# Patient Record
Sex: Male | Born: 1980 | Race: White | Hispanic: No | Marital: Married | Smoking: Never smoker
Health system: Southern US, Community
[De-identification: ages and names within clinical notes are randomized; demographics above are authoritative.]

## PROBLEM LIST (undated history)

## (undated) DIAGNOSIS — B009 Herpesviral infection, unspecified: Secondary | ICD-10-CM

## (undated) DIAGNOSIS — C6290 Malignant neoplasm of unspecified testis, unspecified whether descended or undescended: Secondary | ICD-10-CM

## (undated) HISTORY — DX: Malignant neoplasm of unspecified testis, unspecified whether descended or undescended: C62.90

## (undated) HISTORY — DX: Herpesviral infection, unspecified: B00.9

## (undated) HISTORY — PX: WISDOM TOOTH EXTRACTION: SHX21

## (undated) HISTORY — PX: ORCHIECTOMY: SHX2116

---

## 2002-05-18 ENCOUNTER — Ambulatory Visit (HOSPITAL_COMMUNITY): Admission: RE | Admit: 2002-05-18 | Discharge: 2002-05-18 | Payer: Self-pay | Admitting: Urology

## 2002-05-18 ENCOUNTER — Encounter (INDEPENDENT_AMBULATORY_CARE_PROVIDER_SITE_OTHER): Payer: Self-pay

## 2002-06-07 ENCOUNTER — Encounter: Admission: RE | Admit: 2002-06-07 | Discharge: 2002-06-07 | Payer: Self-pay | Admitting: Urology

## 2002-06-07 ENCOUNTER — Encounter: Payer: Self-pay | Admitting: Urology

## 2002-06-15 ENCOUNTER — Ambulatory Visit (HOSPITAL_COMMUNITY): Admission: RE | Admit: 2002-06-15 | Discharge: 2002-06-15 | Payer: Self-pay | Admitting: Oncology

## 2002-06-16 ENCOUNTER — Ambulatory Visit: Admission: RE | Admit: 2002-06-16 | Discharge: 2002-06-16 | Payer: Self-pay | Admitting: Oncology

## 2002-06-17 ENCOUNTER — Encounter: Admission: RE | Admit: 2002-06-17 | Discharge: 2002-06-17 | Payer: Self-pay | Admitting: Oncology

## 2002-06-17 ENCOUNTER — Encounter: Payer: Self-pay | Admitting: Oncology

## 2002-06-17 ENCOUNTER — Ambulatory Visit (HOSPITAL_COMMUNITY): Admission: RE | Admit: 2002-06-17 | Discharge: 2002-06-17 | Payer: Self-pay | Admitting: Oncology

## 2002-06-24 ENCOUNTER — Encounter: Payer: Self-pay | Admitting: Oncology

## 2002-06-24 ENCOUNTER — Ambulatory Visit (HOSPITAL_COMMUNITY): Admission: RE | Admit: 2002-06-24 | Discharge: 2002-06-24 | Payer: Self-pay | Admitting: Oncology

## 2002-07-18 ENCOUNTER — Emergency Department (HOSPITAL_COMMUNITY): Admission: EM | Admit: 2002-07-18 | Discharge: 2002-07-18 | Payer: Self-pay | Admitting: Emergency Medicine

## 2002-08-26 ENCOUNTER — Encounter: Payer: Self-pay | Admitting: Oncology

## 2002-08-26 ENCOUNTER — Encounter: Admission: RE | Admit: 2002-08-26 | Discharge: 2002-08-26 | Payer: Self-pay | Admitting: Oncology

## 2002-09-24 ENCOUNTER — Encounter: Payer: Self-pay | Admitting: Oncology

## 2002-09-24 ENCOUNTER — Ambulatory Visit (HOSPITAL_COMMUNITY): Admission: RE | Admit: 2002-09-24 | Discharge: 2002-09-24 | Payer: Self-pay | Admitting: Oncology

## 2002-10-05 ENCOUNTER — Encounter: Payer: Self-pay | Admitting: *Deleted

## 2002-10-05 ENCOUNTER — Encounter: Admission: RE | Admit: 2002-10-05 | Discharge: 2002-10-05 | Payer: Self-pay | Admitting: *Deleted

## 2002-12-08 ENCOUNTER — Encounter: Payer: Self-pay | Admitting: Oncology

## 2002-12-08 ENCOUNTER — Ambulatory Visit (HOSPITAL_COMMUNITY): Admission: RE | Admit: 2002-12-08 | Discharge: 2002-12-08 | Payer: Self-pay | Admitting: Oncology

## 2002-12-14 ENCOUNTER — Encounter: Payer: Self-pay | Admitting: *Deleted

## 2002-12-14 ENCOUNTER — Encounter: Admission: RE | Admit: 2002-12-14 | Discharge: 2002-12-14 | Payer: Self-pay | Admitting: *Deleted

## 2002-12-17 ENCOUNTER — Ambulatory Visit: Admission: RE | Admit: 2002-12-17 | Discharge: 2002-12-17 | Payer: Self-pay | Admitting: Urology

## 2002-12-31 ENCOUNTER — Encounter: Payer: Self-pay | Admitting: Oncology

## 2002-12-31 ENCOUNTER — Encounter: Admission: RE | Admit: 2002-12-31 | Discharge: 2002-12-31 | Payer: Self-pay | Admitting: Oncology

## 2003-02-12 ENCOUNTER — Encounter: Payer: Self-pay | Admitting: Oncology

## 2003-02-12 ENCOUNTER — Ambulatory Visit (HOSPITAL_COMMUNITY): Admission: RE | Admit: 2003-02-12 | Discharge: 2003-02-12 | Payer: Self-pay | Admitting: Oncology

## 2003-04-01 ENCOUNTER — Ambulatory Visit (HOSPITAL_COMMUNITY): Admission: RE | Admit: 2003-04-01 | Discharge: 2003-04-01 | Payer: Self-pay | Admitting: Oncology

## 2003-05-28 HISTORY — PX: OTHER SURGICAL HISTORY: SHX169

## 2003-07-29 ENCOUNTER — Ambulatory Visit (HOSPITAL_COMMUNITY): Admission: RE | Admit: 2003-07-29 | Discharge: 2003-07-29 | Payer: Self-pay | Admitting: Oncology

## 2003-08-26 ENCOUNTER — Inpatient Hospital Stay (HOSPITAL_COMMUNITY): Admission: AD | Admit: 2003-08-26 | Discharge: 2003-09-04 | Payer: Self-pay

## 2003-09-07 ENCOUNTER — Ambulatory Visit (HOSPITAL_COMMUNITY): Admission: RE | Admit: 2003-09-07 | Discharge: 2003-09-07 | Payer: Self-pay | Admitting: General Surgery

## 2003-09-30 ENCOUNTER — Ambulatory Visit (HOSPITAL_COMMUNITY): Admission: RE | Admit: 2003-09-30 | Discharge: 2003-09-30 | Payer: Self-pay | Admitting: Oncology

## 2003-12-02 ENCOUNTER — Ambulatory Visit (HOSPITAL_COMMUNITY): Admission: RE | Admit: 2003-12-02 | Discharge: 2003-12-02 | Payer: Self-pay | Admitting: Oncology

## 2004-02-15 ENCOUNTER — Ambulatory Visit (HOSPITAL_COMMUNITY): Admission: RE | Admit: 2004-02-15 | Discharge: 2004-02-15 | Payer: Self-pay | Admitting: Oncology

## 2004-04-01 ENCOUNTER — Ambulatory Visit: Payer: Self-pay | Admitting: Oncology

## 2004-05-22 ENCOUNTER — Encounter: Admission: RE | Admit: 2004-05-22 | Discharge: 2004-05-22 | Payer: Self-pay | Admitting: Oncology

## 2004-07-04 ENCOUNTER — Ambulatory Visit: Payer: Self-pay | Admitting: Oncology

## 2004-07-13 ENCOUNTER — Ambulatory Visit (HOSPITAL_COMMUNITY): Admission: RE | Admit: 2004-07-13 | Discharge: 2004-07-13 | Payer: Self-pay | Admitting: Oncology

## 2004-10-04 ENCOUNTER — Ambulatory Visit: Payer: Self-pay | Admitting: Oncology

## 2004-11-09 ENCOUNTER — Encounter: Admission: RE | Admit: 2004-11-09 | Discharge: 2004-11-09 | Payer: Self-pay | Admitting: Oncology

## 2004-11-19 ENCOUNTER — Ambulatory Visit: Payer: Self-pay | Admitting: Oncology

## 2005-02-28 IMAGING — CT CT CHEST W/ CM
2 of 5 series · 13 of 32 positions shown, 19 images · non-contrast
Comparison: none

CLINICAL DATA: Follow-up testicular carcinoma.

[Series 4: a/p 5.0 b30f · axial · 0.68mm/px · z∈[-497,-127]mm · 11 of 90 slices shown, 17 images]
[im 8/90  soft-tissue]
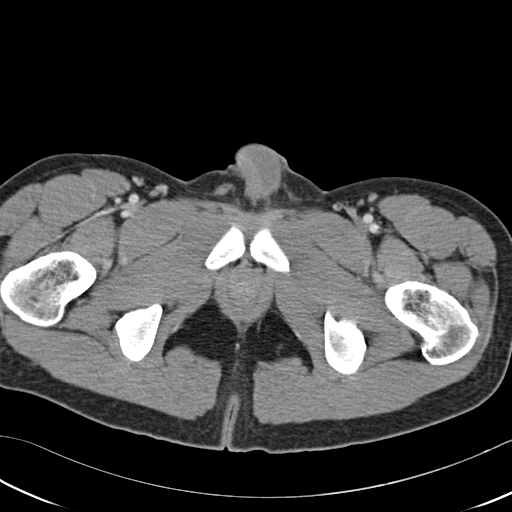
[im 8/90  bone]
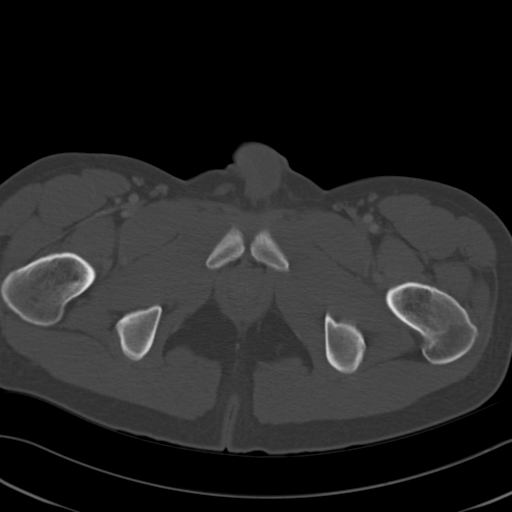
[im 15/90  soft-tissue]
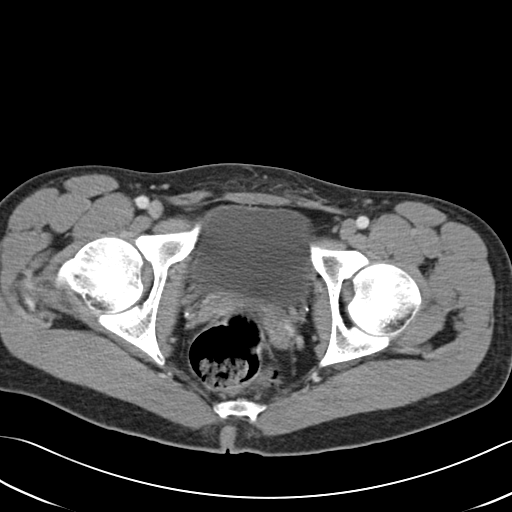
[im 23/90  soft-tissue]
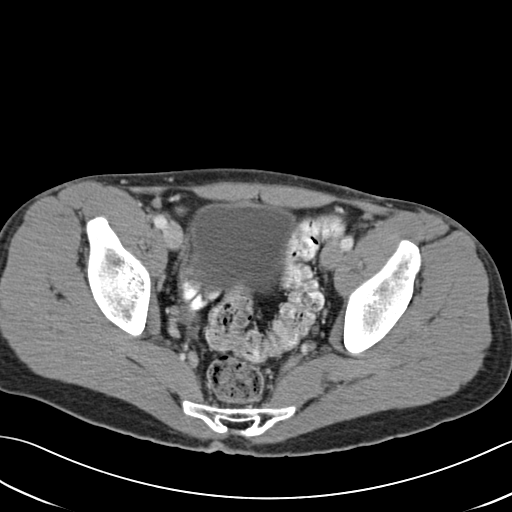
[im 30/90  soft-tissue]
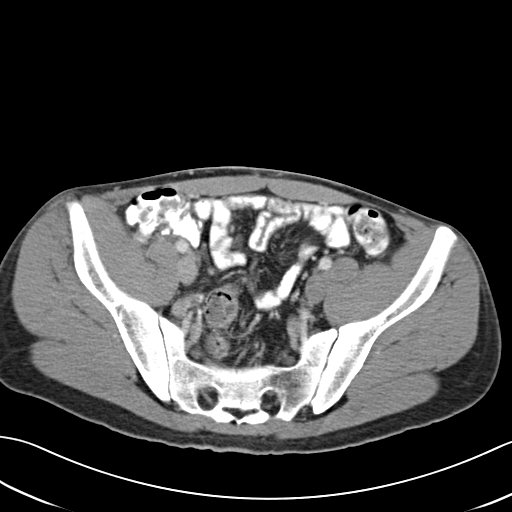
[im 38/90  soft-tissue]
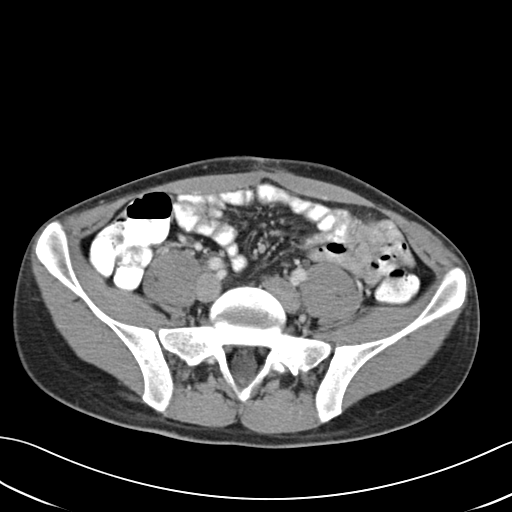
[im 45/90  soft-tissue]
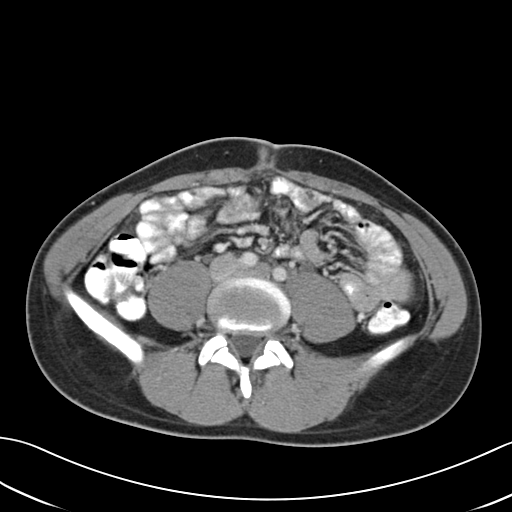
[im 52/90  soft-tissue]
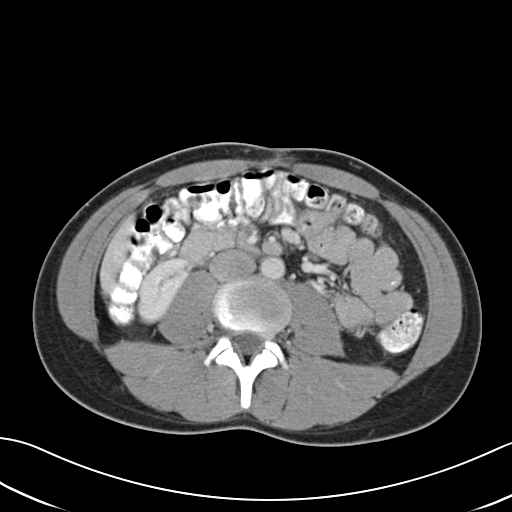
[im 60/90  soft-tissue]
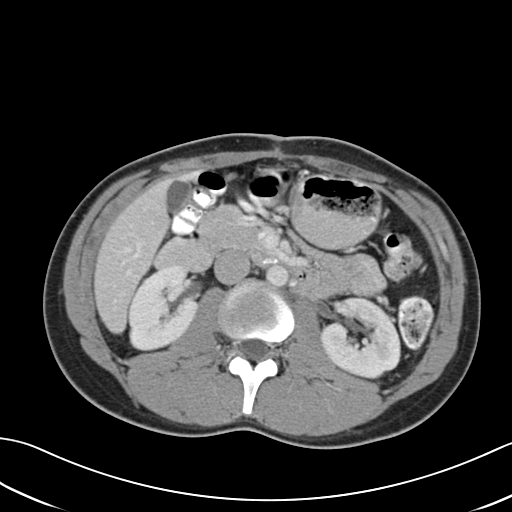
[im 60/90  lung]
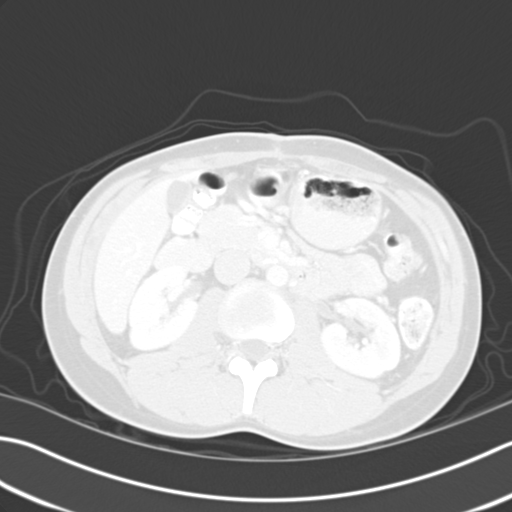
[im 67/90  soft-tissue]
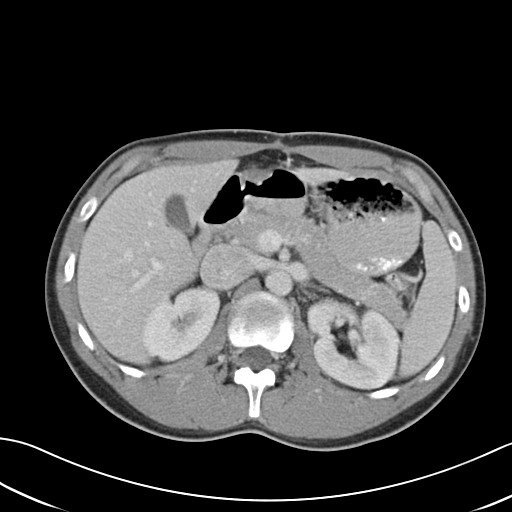
[im 67/90  lung]
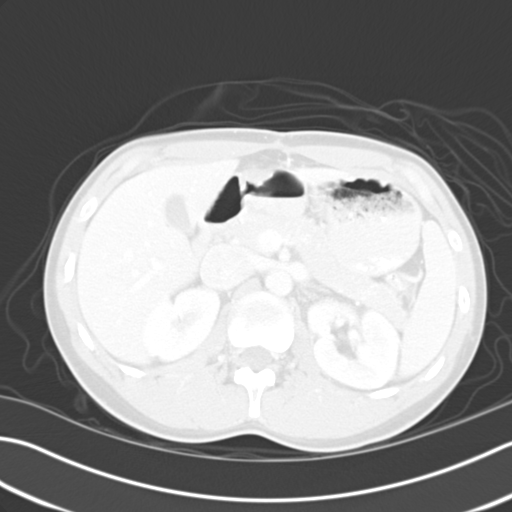
[im 67/90  bone]
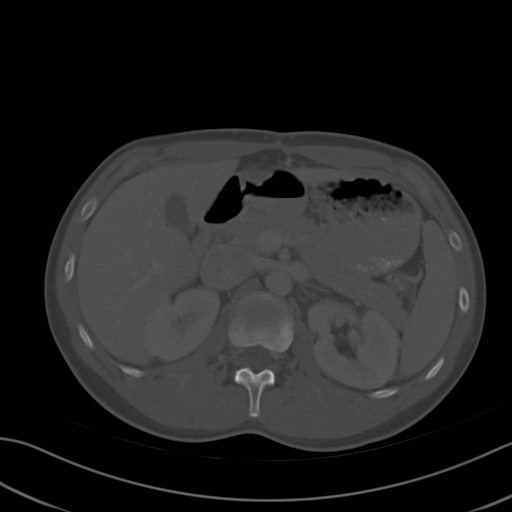
[im 75/90  soft-tissue]
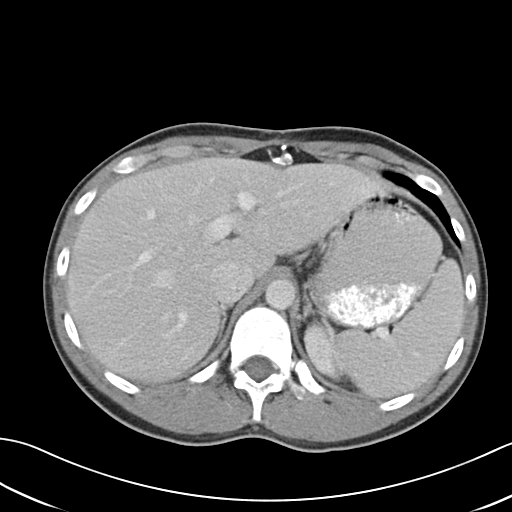
[im 75/90  lung]
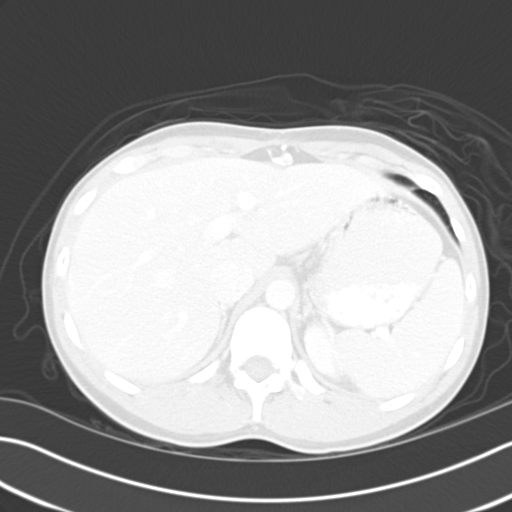
[im 82/90  soft-tissue]
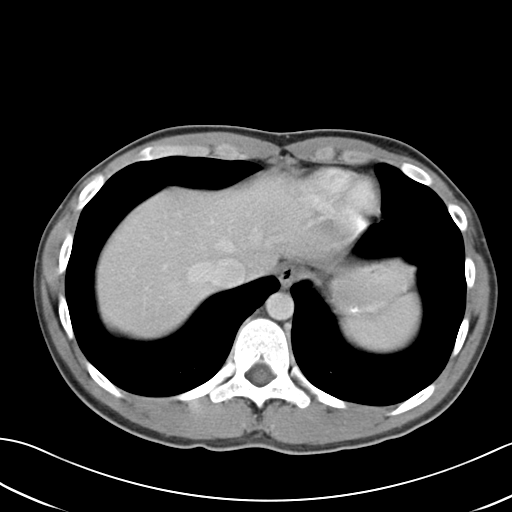
[im 82/90  lung]
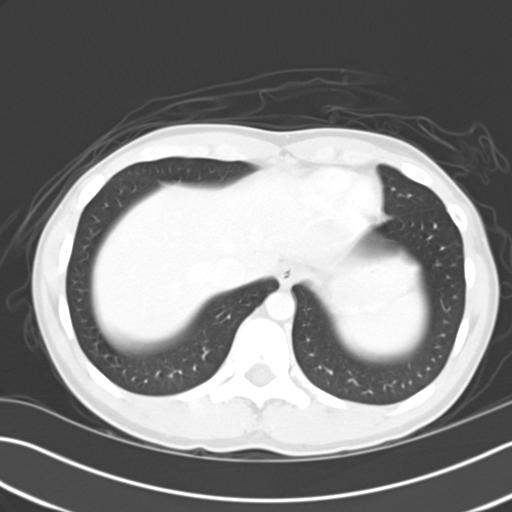

[Series 6: blad del 5.0 b30f · axial · 0.68mm/px · z∈[-492,-452]mm · 2 of 26 slices shown]
[im 9/26  soft-tissue]
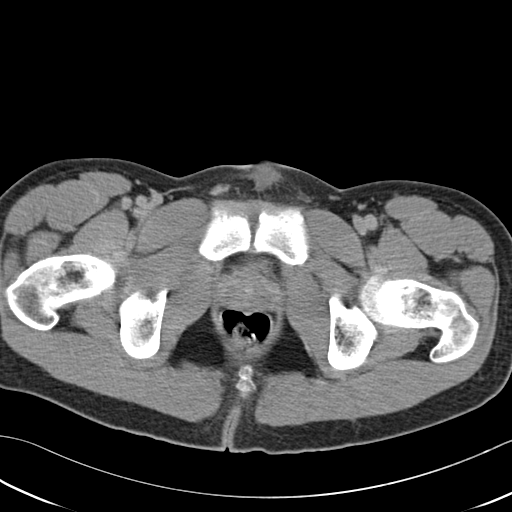
[im 17/26  soft-tissue]
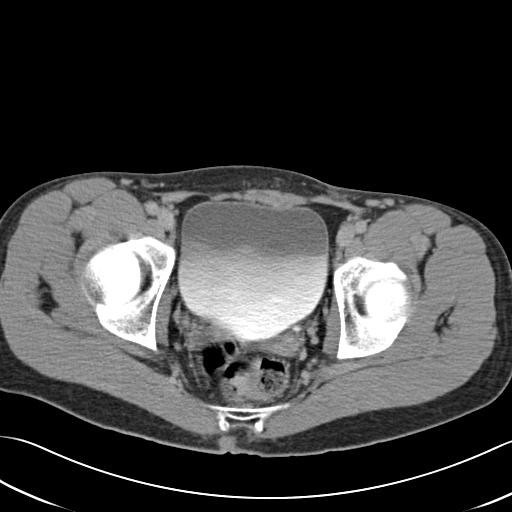

[13 of 32 positions shown; findings below may reference images not displayed]

CT SCAN OF THE CHEST WITH CONTRAST
 Multiple spiral images were made through chest after intravenous injection of 150 cc of Omnipaque 300.  Comparison is made with the prior examination of 12/08/02.

 There are four subpleural nodules.  They are all unchanged in size.  2 mm subpleural nodules are demonstrated on image #15 in the right upper lobe and on images #28 and #29 in the right lower lobe.  There is a 4 mm subpleural nodule unchanged in the posterior aspect of the right lower lobe on image #36.  There are no new lung abnormalities with no hilar or mediastinal adenopathy.  There is no bone abnormality.

 IMPRESSION
 Stable CT scan of the chest with no evidence for metastatic disease.

 CT SCAN OF THE ABDOMEN WITH CONTRAST
 Additional images through the abdomen after oral and IV contrast demonstrate once again the calcification in the anterior aspect of the abdomen just inferior to the sternum and anterior to the lateral segment of the left lobe of the liver.  The liver, spleen, and pancreas are normal.  The kidneys and retroperitoneal structures are normal.  Surgical clips are demonstrated adjacent to the abdominal aorta just inferior to the left renal vein.  There is no adenopathy or mass in that region.

 IMPRESSION
 Stable CT scan of the abdomen with contrast with no evidence for metastatic disease.

 CT SCAN OF THE PELVIS WITH CONTRAST
 Additional images through the after oral and IV contrast demonstrate no pelvic or inguinal adenopathy.  There is no free fluid or mass effect.

 IMPRESSION
 Negative CT scan of the pelvis with contrast unchanged.

## 2005-05-01 ENCOUNTER — Encounter: Admission: RE | Admit: 2005-05-01 | Discharge: 2005-05-01 | Payer: Self-pay | Admitting: Oncology

## 2005-05-07 ENCOUNTER — Ambulatory Visit: Payer: Self-pay | Admitting: Oncology

## 2005-10-28 ENCOUNTER — Ambulatory Visit: Payer: Self-pay | Admitting: Oncology

## 2005-11-01 ENCOUNTER — Ambulatory Visit (HOSPITAL_COMMUNITY): Admission: RE | Admit: 2005-11-01 | Discharge: 2005-11-01 | Payer: Self-pay | Admitting: Oncology

## 2005-11-01 LAB — CBC WITH DIFFERENTIAL/PLATELET
Basophils Absolute: 0 10*3/uL (ref 0.0–0.1)
Eosinophils Absolute: 0.1 10*3/uL (ref 0.0–0.5)
MCH: 30.6 pg (ref 28.0–33.4)
MCHC: 34.6 g/dL (ref 32.0–35.9)
MCV: 88.5 fL (ref 81.6–98.0)
MONO#: 0.4 10*3/uL (ref 0.1–0.9)
MONO%: 8.8 % (ref 0.0–13.0)
NEUT#: 2.8 10*3/uL (ref 1.5–6.5)
RBC: 4.77 10*6/uL (ref 4.20–5.71)

## 2005-11-04 LAB — COMPREHENSIVE METABOLIC PANEL
AST: 19 U/L (ref 0–37)
Alkaline Phosphatase: 113 U/L (ref 39–117)
BUN: 16 mg/dL (ref 6–23)
Calcium: 9.3 mg/dL (ref 8.4–10.5)
Chloride: 105 mEq/L (ref 96–112)
Potassium: 4.4 mEq/L (ref 3.5–5.3)
Sodium: 141 mEq/L (ref 135–145)
Total Bilirubin: 0.5 mg/dL (ref 0.3–1.2)
Total Protein: 6.8 g/dL (ref 6.0–8.3)

## 2005-11-04 LAB — BETA HCG QUANT (REF LAB): Beta hCG, Tumor Marker: <1 IU/L (ref 0–3)

## 2005-11-04 LAB — AFP TUMOR MARKER: AFP-Tumor Marker: 2.2 ng/mL (ref 0.0–8.0)

## 2005-11-04 LAB — LACTATE DEHYDROGENASE: LDH: 139 U/L (ref 94–250)

## 2006-04-16 ENCOUNTER — Ambulatory Visit: Payer: Self-pay | Admitting: Oncology

## 2006-04-21 ENCOUNTER — Ambulatory Visit (HOSPITAL_COMMUNITY): Admission: RE | Admit: 2006-04-21 | Discharge: 2006-04-21 | Payer: Self-pay | Admitting: Oncology

## 2006-04-21 LAB — LACTATE DEHYDROGENASE: LDH: 128 U/L (ref 94–250)

## 2006-04-21 LAB — CBC WITH DIFFERENTIAL/PLATELET
BASO%: 0.2 % (ref 0.0–2.0)
EOS%: 3.1 % (ref 0.0–7.0)
HCT: 42.5 % (ref 38.7–49.9)
MONO%: 8.9 % (ref 0.0–13.0)
NEUT#: 3.3 10*3/uL (ref 1.5–6.5)
NEUT%: 64.1 % (ref 40.0–75.0)
RBC: 4.72 10*6/uL (ref 4.20–5.71)
WBC: 5.2 10*3/uL (ref 4.0–10.0)

## 2006-04-21 LAB — COMPREHENSIVE METABOLIC PANEL
ALT: 18 U/L (ref 0–53)
AST: 19 U/L (ref 0–37)
Albumin: 4.1 g/dL (ref 3.5–5.2)
Alkaline Phosphatase: 100 U/L (ref 39–117)
BUN: 12 mg/dL (ref 6–23)
CO2: 31 mEq/L (ref 19–32)
Creatinine, Ser: 1.02 mg/dL (ref 0.40–1.50)
Glucose, Bld: 81 mg/dL (ref 70–99)
Total Bilirubin: 0.9 mg/dL (ref 0.3–1.2)

## 2006-04-23 LAB — BETA HCG QUANT (REF LAB): Beta hCG, Tumor Marker: <0.5 m[IU]/mL (ref <5.0)

## 2007-05-14 ENCOUNTER — Ambulatory Visit: Payer: Self-pay | Admitting: Oncology

## 2007-05-18 LAB — CBC WITH DIFFERENTIAL/PLATELET
BASO%: 0.3 % (ref 0.0–2.0)
Basophils Absolute: 0 10*3/uL (ref 0.0–0.1)
EOS%: 2.2 % (ref 0.0–7.0)
HCT: 40.3 % (ref 38.7–49.9)
MCHC: 35.3 g/dL (ref 32.0–35.9)
MONO#: 0.3 10*3/uL (ref 0.1–0.9)
MONO%: 6 % (ref 0.0–13.0)
NEUT#: 3.5 10*3/uL (ref 1.5–6.5)
WBC: 5.3 10*3/uL (ref 4.0–10.0)

## 2007-05-24 LAB — COMPREHENSIVE METABOLIC PANEL
ALT: 11 U/L (ref 0–53)
Alkaline Phosphatase: 88 U/L (ref 39–117)
BUN: 16 mg/dL (ref 6–23)
CO2: 27 mEq/L (ref 19–32)
Chloride: 106 mEq/L (ref 96–112)
Creatinine, Ser: 0.99 mg/dL (ref 0.40–1.50)
Glucose, Bld: 80 mg/dL (ref 70–99)
Potassium: 4.4 mEq/L (ref 3.5–5.3)
Sodium: 143 mEq/L (ref 135–145)
Total Bilirubin: 0.5 mg/dL (ref 0.3–1.2)

## 2007-05-26 ENCOUNTER — Ambulatory Visit (HOSPITAL_COMMUNITY): Admission: RE | Admit: 2007-05-26 | Discharge: 2007-05-26 | Payer: Self-pay | Admitting: Oncology

## 2009-09-13 ENCOUNTER — Emergency Department (HOSPITAL_COMMUNITY): Admission: EM | Admit: 2009-09-13 | Discharge: 2009-09-13 | Payer: Self-pay | Admitting: Emergency Medicine

## 2010-03-11 ENCOUNTER — Ambulatory Visit (HOSPITAL_COMMUNITY): Admission: RE | Admit: 2010-03-11 | Discharge: 2010-03-11 | Payer: Self-pay | Admitting: Emergency Medicine

## 2010-06-16 ENCOUNTER — Encounter: Payer: Self-pay | Admitting: Oncology

## 2010-06-17 ENCOUNTER — Encounter: Payer: Self-pay | Admitting: Oncology

## 2010-08-14 LAB — DIFFERENTIAL
Basophils Absolute: 0 10*3/uL (ref 0.0–0.1)
Basophils Relative: 0 % (ref 0–1)
Lymphocytes Relative: 16 % (ref 12–46)
Monocytes Absolute: 0.8 10*3/uL (ref 0.1–1.0)
Neutro Abs: 5.2 10*3/uL (ref 1.7–7.7)
Neutrophils Relative %: 70 % (ref 43–77)

## 2010-08-14 LAB — LIPASE, BLOOD: Lipase: 27 U/L (ref 11–59)

## 2010-08-14 LAB — CBC
HCT: 42.5 % (ref 39.0–52.0)
MCHC: 34 g/dL (ref 30.0–36.0)
RBC: 4.62 MIL/uL (ref 4.22–5.81)
WBC: 7.4 10*3/uL (ref 4.0–10.5)

## 2010-08-14 LAB — COMPREHENSIVE METABOLIC PANEL
Albumin: 4.2 g/dL (ref 3.5–5.2)
Alkaline Phosphatase: 91 U/L (ref 39–117)
BUN: 11 mg/dL (ref 6–23)
CO2: 31 mEq/L (ref 19–32)
Chloride: 101 mEq/L (ref 96–112)
Creatinine, Ser: 0.89 mg/dL (ref 0.4–1.5)
GFR calc non Af Amer: >60 mL/min (ref >60)
Potassium: 3.9 mEq/L (ref 3.5–5.1)

## 2010-10-12 NOTE — Discharge Summary (Signed)
NAME:  David, Wallace                     ACCOUNT NO.:  [id number]   MEDICAL RECORD NO.:  [id number]                   PATIENT TYPE:  INP   LOCATION:  0480                                 FACILITY:  [facility]   PHYSICIAN:  Lorre Munroe., M.D.            DATE OF BIRTH:  03/06/1981   DATE OF ADMISSION:  08/26/2003  DATE OF DISCHARGE:  09/04/2003                                 DISCHARGE SUMMARY   HISTORY:  This is a 30 year old white male admitted because of abdominal  pain, vomiting, and x-rays which were consistent with small intestinal  obstruction. He has a history of orchiectomy and retroperitoneal lymph node  dissection for testicular cancer. Recent CT scans had demonstrated no  evidence of any persistent or recurrent cancer. His physical exam was free  of supraclavicular lymphadenopathy, abdominal mass, testicular mass, groin  mass, or other evidence of cancer. See my history and physical for details.   HOSPITAL COURSE:  The patient was admitted and kept NPO. Nasogastric suction  was employed. The nasogastric tube drained a large amount of bowel. The  patient continued to have crampy pain and the x-rays continued to have the  appearance of small intestinal obstruction. I recommend an exploratory  laparotomy, and he and his parents consented. I operated on him on August 28, 2003, finding extensive adhesions with one particularly obstructive band  across the distal small bowel. Lysis of adhesions relieved the small bowel  obstruction. On exploration there was no evidence of any cancer in the  retroperitoneum, abdomen, or liver. The patient generally well  postoperatively, although he had mild fever and had very slow return of  gastrointestinal function. He remarked that this had been the case after his  retroperitoneal lymph node dissection as well. He continued to slowly  improve and on September 04, 2003, he was tolerating a diet, passing gas well,  and was free of nausea. The  abdomen was soft, wound clean and intact.  Staples were removed and he was discharged. Arrangements were made for  follow up in the office in several days.   DIAGNOSES:  1. Small intestinal obstruction due to adhesions.  2. History of testicular cancer with retroperitoneal node metastasis,     currently with no evidence of disease.   OPERATION:  Lysis of adhesions.   CONDITION ON DISCHARGE:  Improved.                                               Lorre Munroe., M.D.    WB/MEDQ  D:  09/13/2003  T:  09/13/2003  Job:  [id number]   cc:   Genene Churn. Cyndie Chime, M.D.  [address] [facility]  [place]  [place] [address]  Fax: [phone]

## 2010-10-12 NOTE — Op Note (Signed)
NAME:  David Wallace, David Wallace                     ACCOUNT NO.:  [id number]   MEDICAL RECORD NO.:  [id number]                   PATIENT TYPE:  INP   LOCATION:  0480                                 FACILITY:  [facility]   PHYSICIAN:  Lorre Munroe., M.D.            DATE OF BIRTH:  Jan 03, 1981   DATE OF PROCEDURE:  08/28/2003  DATE OF DISCHARGE:                                 OPERATIVE REPORT   PREOPERATIVE DIAGNOSIS:  Small intestinal obstruction due to adhesions.   POSTOPERATIVE DIAGNOSIS:  Small intestinal obstruction due to adhesions.   OPERATION:  Lysis of adhesions.   SURGEON:  Lebron Conners, M.D.   ASSISTANT:  Ollen Gross. Carolynne Edouard, M.D.   ANESTHESIA:  General.   DESCRIPTION OF PROCEDURE:  After the patient was monitored and anesthetized  and had routine preparation and draping of the abdomen and insertion of a  Foley catheter, I incised his midline incision because it was wide and  unsightly to the patient and undermined the subcutaneous tissues for tension  free closure at the end of the operation.  I incised the midline fascia and  bluntly entered the peritoneum and found that there was freedom from  adhesions of the bowel to the anterior abdominal wall.  I opened the fascia  from just above the umbilicus down to the lower end of the wound and noted  extremely dilated small intestine.  I followed the dilated bowel down and  discovered a band crossing the intestine which was causing obstruction. I  cut that with the scissors and that appeared to relieve the obstruction.  I  did note that there was a good bit of clear free fluid in the abdominal  cavity and I noted some mild ecchymosis of the bowel. There was no sign of  perforation or ischemia.  There were numerous other adhesions present which  I took down until I could run the bowel from the cecum to the ligamen of  Treitz and found it entire free of adhesions.  I inspected again and saw no  signs of injury. I thoroughly explored  the abdomen finding no evidence of  retroperitoneal lymphadenopathy, no masses in the liver, normal spleen and  all other viscera appeared normal as well.  After correct sponge, needle and  instrument counts were correct, I closed the fascia with running #1 PDS and  closed the skin with staples.  He tolerated the operation well.                                               Lorre Munroe., M.D.    WB/MEDQ  D:  09/02/2003  T:  09/02/2003  Job:  [id number]   cc:   Genene Churn. Cyndie Chime, M.D.  [address] [facility]  [place]  [place] [address]  Fax: [phone]

## 2010-10-12 NOTE — H&P (Signed)
NAME:  David Wallace, David Wallace                     ACCOUNT NO.:  [id number]   MEDICAL RECORD NO.:  [id number]                   PATIENT TYPE:  INP   LOCATION:  0480                                 FACILITY:  [facility]   PHYSICIAN:  Lorre Munroe., M.D.            DATE OF BIRTH:  1980/11/25   DATE OF ADMISSION:  08/26/2003  DATE OF DISCHARGE:                                HISTORY & PHYSICAL   CHIEF COMPLAINT:  Abdominal pain.   HISTORY OF PRESENT ILLNESS:  The patient is a 30 year old white male with a  two day history of abdominal pain and vomiting a large number of times.  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  He  was brought to [facility] because his parents live in [place].  He has a history of testicular cancer and has had an orchiectomy. The cancer  metastasized to the retroperitoneum and he underwent a retroperitoneal lymph  node dissection and excision of tumor of the retroperitoneum.  He had a CT  scan done on March 4 of this year which showed no evidence of any recurrent  cancer to the chest, abdomen, or pelvis.  The patient had been feeling quite  well, went to school and he just recently got sick except for this condition  he has been a healthy young man.  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  The retroperitoneal  surgery was done in [place].  The patient has not had any fever or  chills.  He has no chronic GI problems.   PAST MEDICAL HISTORY:  He denies all serious chronic ailments. 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  No known allergies. He does not  smoke or drink alcoholic beverages or take any prescription drugs.  He has  not had any operations except for the ones mentioned above.   Family history, childhood illnesses and detailed review of systems are all  unremarkable.   PHYSICAL EXAMINATION:  VITAL SIGNS:  Temperature 100.2.  Vital signs as  reported by the nurse no abnormalities.  GENERAL:  The patient seems quite ill, does not feel well although mental  status is normal and answers questions appropriately. He says he is having  abdominal pain from time to time.  HEENT:  Unremarkable. I do not feel any supraclavicular lymphadenopathy.  CHEST:  Clear to auscultation.  HEART:  Regular rhythm, normal, no murmur or gallop.  ABDOMEN:  Slight distention noted.  Well healed midline incision.  Bowel  sounds are intermittently fairly active.  GENITALIA:  No recurrent tumor noted.  EXTREMITIES:  Normal.  SKIN:  Normal. No lesions are noted.  LYMPH NODES:  None enlarged in the groins, axilla or neck.   IMPRESSION:  Small bowel obstruction probably due to adhesions.   PLAN:  NG suction, close followup.                                              Lorre Munroe., M.D.   Jodi Marble  D:  08/26/2003  T:  08/27/2003  Job:  [id number]

## 2010-10-12 NOTE — Op Note (Signed)
NAME:  David Wallace, David Wallace                     ACCOUNT NO.:  [id number]   MEDICAL RECORD NO.:  [id number]                   PATIENT TYPE:  AMB   LOCATION:  DAY                                  FACILITY:  [facility]   PHYSICIAN:  Jamison Neighbor, M.D.               DATE OF BIRTH:  December 05, 1980   DATE OF PROCEDURE:  DATE OF DISCHARGE:                                 OPERATIVE REPORT   PREOPERATIVE DIAGNOSIS:  Left testicular mass, probable testicular  carcinoma.   POSTOPERATIVE DIAGNOSIS:  Testicular carcinoma (mixed germ cell).   PROCEDURE:  Left inguinal orchiectomy with frozen section. Secondary  procedure is implantation of testicular prosthesis.   SURGEON:  Jamison Neighbor, M.D.   ASSISTANT:  Melvyn Novas, M.D.   ANESTHESIA:  General.   COMPLICATIONS:  None.   DRAINS:  None.   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 He  recently detected a mass in the left testicle. The patient was  evaluated  with a scrotal ultrasound which confirmed the presence of a solid mass. The  patient and his family were advised this is most likely a testicular  carcinoma, and that inguinal exploration and excision were necessary.   The patient understands the risks and benefits of the procedure and we have  begun preliminary discussions as to the postoperative management,  understanding that radiation, chemotherapy, and/or retroperitoneal lymph  node dissection may be necessary depending on the operative  findings. The  patient has had alphafetoprotein and beta HCG drawn as markers, but these  have not yet returned. The patient gave full and informed consent.   DESCRIPTION OF PROCEDURE:  After successful induction of general anesthesia  the patient was placed in the supine position and prepped with Betadine and  draped in the usual sterile fashion. An incision was made in the left groin  area in the general area of the external ring in the direction of the skin  fibers. This was carried down through the Scarpa's fascia until the external  oblique was identified.   The external oblique was then opened from the area of the internal ring out  through and through the  external ring with care taken to avoid injury to  the underlying nerve. The cord was encircled and was elevated all the way  back to the internal ring. The cord was clamped and the clock was started in  order to monitor the ischemia time.   The gubernaculum  was taken down and the testicle was elevated and brought  up into the incision. The testicle was placed within a pan  which was  separated from the  remainder of the field. The tunica vaginalis was opened.  A small amount of hydrocele fluid was removed and the testicle was  inspected.   Approximately one-third of the testicle was taken up by a dark bluish area,  clearly not a seminoma. There was some  concern that this was a probable germ  cell tumor, nonseminomatous type. The blackened area that contained a large  amount of blood vessels was excised and sent for frozen section.   The tunica albuginea was then reclosed with a running suture of chromic and  the entire testicle was wrapped in gauze. All  instruments used for this,  the frozen section biopsy were removed and the gloves were changed. The  testicle itself appeared abnormal enough that it was felt that we did not  need to wait for a  frozen section and proceeded with the orchiectomy.   The cord was divided in half, and each of the two pedicles was doubly  clamped and  the cord was divided. Each of the pedicles was then tied off  with a suture ligature of 0 silk. All of these were left somewhat long in  case a retroperitoneal lymph node dissection was done at a later date. The  cord stump was allowed to retract back up under the internal ring.   The area was irrigated and then closed with a running suture of 0 Vicryl.  The patient underwent Marcaine infiltration into  the cord remnant as well as  into the entire area. Before closing the Scarpa's fascia, the testicular  prosthesis was prepared on the back table by filling with saline. The  implant was sutured in place with a silk suture placed at the approximate  location of the gubernacular attachments deep in the left scrotum. The  medium sized device was selected which appeared to be the closest match to  the patient's right testicle and cosmetically this was a good fit.   The entire area was irrigated with antibiotic solution. The Scarpa's fascia  was then closed with a 3-0 Vicryl and the skin was closed with a 4-0 Vicryl  subcuticular and Steri-Strips. A dressing was applied. Ice was placed to the  groin area in the recovery area. The patient was given a scrotal support and  fluffs.   The patient tolerated the procedure well and was taken to the recovery room  in good condition. He will be sent home with Tylox and Keflex.   ADDENDUM:  Following the procedure the tumor itself was inspected by me  along with the pathologist, and this appears to be a mixed germ cell tumor,  primarily teratoma, but with some yolk sac elements  and possibly some  chorio carcinoma elements.  These findings were related to the patient's  family.                                               Jamison Neighbor, M.D.    RJE/MEDQ  D:  05/18/2002  T:  05/19/2002  Job:  [id number]

## 2011-07-15 ENCOUNTER — Telehealth: Payer: Self-pay

## 2011-07-15 NOTE — Telephone Encounter (Signed)
.  [facility]    PT REQUESTING  COPIES OF LAST LABS,WILL PICK UP   BEST PHONE [phone]

## 2011-07-16 NOTE — Telephone Encounter (Signed)
Patient's last labs are ready for pick up per patient's request

## 2011-08-14 ENCOUNTER — Other Ambulatory Visit: Payer: Self-pay | Admitting: Physician Assistant

## 2011-10-12 ENCOUNTER — Other Ambulatory Visit: Payer: Self-pay | Admitting: Physician Assistant

## 2011-11-24 ENCOUNTER — Other Ambulatory Visit: Payer: Self-pay | Admitting: Physician Assistant

## 2011-11-24 NOTE — Telephone Encounter (Signed)
Please call this patient.  I'm concerned that he's gone through #60 in such a short time.  Please clarify.  If he's having outbreaks so frequently, I advise we change him to daily suppressive therapy (1000 mg QD, #90, No RF).  Also, he's due for F/U in July.

## 2011-12-03 ENCOUNTER — Telehealth: Payer: Self-pay

## 2011-12-03 MED ORDER — VALACYCLOVIR HCL 500 MG PO TABS: ORAL_TABLET | ORAL | Status: DC

## 2011-12-03 NOTE — Telephone Encounter (Signed)
Told pt he could pick up rx.

## 2011-12-03 NOTE — Telephone Encounter (Signed)
Rx done and sent to pharmacy 

## 2011-12-03 NOTE — Telephone Encounter (Signed)
PT HAS BEEN ON A RX FOR VALTREX ONCE A DAY. HE RAN OUT A COUPLE OF DAYS AGO AND IS ALREADY EXPERIENCING BLISTERS IN HIS MOUTH. HE HAS MADE AN APPT. WITH RYAN ON 7/15 TO HAVE IT REFILLED BUT WAS WONDERING IF HE COULD HAVE A FEW TO LAST HIM. [facility] USES [facility] [address] PH: [phone]

## 2011-12-09 ENCOUNTER — Ambulatory Visit (INDEPENDENT_AMBULATORY_CARE_PROVIDER_SITE_OTHER): Payer: 59 | Admitting: Physician Assistant

## 2011-12-09 ENCOUNTER — Encounter: Payer: Self-pay | Admitting: Physician Assistant

## 2011-12-09 VITALS — BP 113/70 | HR 67 | Temp 97.5°F | Resp 16 | Ht 70.0 in | Wt 174.4 lb

## 2011-12-09 DIAGNOSIS — E785 Hyperlipidemia, unspecified: Secondary | ICD-10-CM

## 2011-12-09 DIAGNOSIS — E789 Disorder of lipoprotein metabolism, unspecified: Secondary | ICD-10-CM

## 2011-12-09 DIAGNOSIS — B009 Herpesviral infection, unspecified: Secondary | ICD-10-CM

## 2011-12-09 LAB — COMPREHENSIVE METABOLIC PANEL
ALT: 14 U/L (ref 0–53)
Albumin: 4.2 g/dL (ref 3.5–5.2)
Creat: 0.99 mg/dL (ref 0.50–1.35)
Potassium: 4.1 mEq/L (ref 3.5–5.3)
Total Bilirubin: 0.5 mg/dL (ref 0.3–1.2)
Total Protein: 6.7 g/dL (ref 6.0–8.3)

## 2011-12-09 LAB — LIPID PANEL
Cholesterol: 199 mg/dL (ref 0–200)
HDL: 43 mg/dL (ref >39)
LDL Cholesterol: 138 mg/dL — ABNORMAL HIGH (ref 0–99)
Triglycerides: 89 mg/dL (ref <150)
VLDL: 18 mg/dL (ref 0–40)

## 2011-12-09 MED ORDER — VALACYCLOVIR HCL 1 G PO TABS: 1000.0000 mg | ORAL_TABLET | Freq: Two times a day (BID) | ORAL | Status: DC

## 2011-12-09 NOTE — Progress Notes (Signed)
Patient ID: David Wallace MRN: [id number], DOB: 14-Sep-1980, 31 y.o. Date of Encounter: 12/09/2011, 3:36 PM  Primary Physician: No primary provider on file.  Chief Complaint: Medication refill   HPI: 31 y.o. year old male with history below presents for refill of Valtrex 1000 mg. 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 Doing well without issues or complaints. Taking medication daily without adverse effects. Has been on medication for years. He unfortunately had testicular cancer at age 69 and underwent chemotherapy. He feels like this may have made him more susceptible to gaining mouth infections. He does currently have a well healing lesion along the inner portion of his lower lip.   He also requests to have his cholesterol checked today. 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 He does try to choose healthy options. Previous cholesterol check was borderline elevated.   Past Medical History  Diagnosis Date  . Testicular cancer     age 30  . HSV-1 (herpes simplex virus 1) infection      Home Meds: Prior to Admission medications   Medication Sig Start Date End Date Taking? Authorizing Provider  valACYclovir (VALTREX) 1000 MG tablet Take 1 tablet (1,000 mg total) by mouth 2 (two) times daily. 12/09/11 12/08/12  Sondra Barges, PA-C    Allergies: No Known Allergies  History   Social History  . Marital Status: Single    Spouse Name: N/A    Number of Children: N/A  . Years of Education: N/A   Occupational History  . Not on file.   Social History Main Topics  . Smoking status: Never Smoker   . Smokeless tobacco: Not on file  . Alcohol Use: Not on file  . Drug Use: Not on file  . Sexually Active: Not on file   Other Topics Concern  . Not on file   Social History Narrative  . No narrative on file     Review of Systems: Constitutional: negative for chills, fever, night sweats, weight changes, or fatigue  HEENT: negative for vision changes, hearing loss, congestion,  rhinorrhea, ST, epistaxis, or sinus pressure Cardiovascular: negative for chest pain or palpitations Respiratory: negative for hemoptysis, wheezing, shortness of breath, or cough Abdominal: negative for abdominal pain, nausea, vomiting, diarrhea, or constipation Dermatological: negative for rash Neurologic: negative for headache, dizziness, or syncope All other systems reviewed and are otherwise negative with the exception to those above and in the HPI.   Physical Exam: Blood pressure 113/70, pulse 67, temperature 97.5 F (36.4 C), temperature source Oral, resp. rate 16, height 5\' 10"  (1.778 m), weight 174 lb 6.4 oz (79.107 kg)., Body mass index is 25.02 kg/(m^2). General: Well developed, well nourished, in no acute distress. Head: Normocephalic, atraumatic, eyes without discharge, sclera non-icteric, nares are without discharge. Bilateral auditory canals clear, TM's are without perforation, pearly grey and translucent with reflective cone of light bilaterally. Oral cavity moist with well healing ulcer along inside portion of lower lip, posterior pharynx without exudate, erythema, peritonsillar abscess, or post nasal drip.  Neck: Supple. No thyromegaly. Full ROM. No lymphadenopathy. Lungs: Clear bilaterally to auscultation without wheezes, rales, or rhonchi. Breathing is unlabored. Heart: RRR with S1 S2. No murmurs, rubs, or gallops appreciated. Msk:  Strength and tone normal for age. Extremities/Skin: Warm and dry. No clubbing or cyanosis. No edema. No rashes or suspicious lesions. Neuro: Alert and oriented X 3. Moves all extremities spontaneously. Gait is normal. CNII-XII grossly in tact. Psych:  Responds to questions appropriately with a normal  affect.   Labs: CMP and Lipid pending. Patient is not fasting.  ASSESSMENT AND PLAN:  31 y.o. year old male with HSV 1 here for medication refill and cholesterol screen -Refilled Valtrex 1 gram 1 po daily #30 RF 11 -Await labs -Follow up  pending labs  Signed, Eula Listen, PA-C 12/09/2011 3:36 PM

## 2012-05-07 ENCOUNTER — Ambulatory Visit (INDEPENDENT_AMBULATORY_CARE_PROVIDER_SITE_OTHER): Payer: 59 | Admitting: Physician Assistant

## 2012-05-07 VITALS — BP 106/72 | HR 66 | Temp 97.8°F | Resp 16 | Ht 70.5 in | Wt 181.6 lb

## 2012-05-07 DIAGNOSIS — J40 Bronchitis, not specified as acute or chronic: Secondary | ICD-10-CM

## 2012-05-07 DIAGNOSIS — J9801 Acute bronchospasm: Secondary | ICD-10-CM

## 2012-05-07 DIAGNOSIS — R05 Cough: Secondary | ICD-10-CM

## 2012-05-07 MED ORDER — ALBUTEROL SULFATE (2.5 MG/3ML) 0.083% IN NEBU
2.5000 mg | INHALATION_SOLUTION | Freq: Once | RESPIRATORY_TRACT | Status: AC
  Administered 2012-05-07: 2.5 mg via RESPIRATORY_TRACT

## 2012-05-07 MED ORDER — HYDROCODONE-HOMATROPINE 5-1.5 MG/5ML PO SYRP: ORAL_SOLUTION | ORAL | Status: DC

## 2012-05-07 MED ORDER — ALBUTEROL SULFATE HFA 108 (90 BASE) MCG/ACT IN AERS: 2.0000 | INHALATION_SPRAY | RESPIRATORY_TRACT | Status: DC | PRN

## 2012-05-07 MED ORDER — AZITHROMYCIN 250 MG PO TABS: ORAL_TABLET | ORAL | Status: DC

## 2012-05-07 MED ORDER — IPRATROPIUM BROMIDE 0.06 % NA SOLN: 2.0000 | Freq: Three times a day (TID) | NASAL | Status: DC

## 2012-05-07 NOTE — Progress Notes (Signed)
Patient ID: David Wallace MRN: [id number], DOB: 1980/08/29, 29 y.o. Date of Encounter: 05/07/2012, 2:23 PM  Primary Physician: Tonye Pearson, MD  Chief Complaint:  Chief Complaint  Patient presents with  . Cough    productive cough - dark yellow sputum x 1-2 weeks    HPI: 31 y.o. year old male presents with a 1-2 week history of nasal congestion, post nasal drip, sore throat, and cough. Mild sinus pressure at symptom onset. Symptoms improved for a few days then he developed a cough. Afebrile. No chills. Still has mild nasal congestion. Cough is productive of green/yellow sputum and not associated with time of day. No wheezing, chest pain, or shortness of breath. Ears feel full, leading to sensation of muffled hearing. Has tried OTC cold preps without success. No GI complaints. Appetite normal.  No sick contacts, recent antibiotics, or recent travels.   No leg trauma, sedentary periods, h/o cancer, or tobacco use.  Past Medical History  Diagnosis Date  . Testicular cancer     age 31  . HSV-1 (herpes simplex virus 1) infection      Home Meds: Prior to Admission medications   Medication Sig Start Date End Date Taking? Authorizing Provider  valACYclovir (VALTREX) 1000 MG tablet Take 1 tablet (1,000 mg total) by mouth 2 (two) times daily. 12/09/11 12/08/12  Sondra Barges, PA-C    Allergies: No Known Allergies  History   Social History  . Marital Status: Married    Spouse Name: N/A    Number of Children: N/A  . Years of Education: N/A   Occupational History  . Not on file.   Social History Main Topics  . Smoking status: Never Smoker   . Smokeless tobacco: Not on file  . Alcohol Use: Not on file  . Drug Use: Not on file  . Sexually Active: Not on file   Other Topics Concern  . Not on file   Social History Narrative  . No narrative on file     Review of Systems: Constitutional: negative for chills, fever, night sweats or weight changes Cardiovascular:  negative for chest pain or palpitations Respiratory: negative for hemoptysis, wheezing, or shortness of breath Abdominal: negative for abdominal pain, nausea, vomiting or diarrhea Dermatological: negative for rash Neurologic: negative for headache   Physical Exam: Blood pressure 106/72, pulse 66, temperature 97.8 F (36.6 C), temperature source Oral, resp. rate 16, height 5' 10.5" (1.791 m), weight 181 lb 9.6 oz (82.373 kg), SpO2 99.00%., Body mass index is 25.69 kg/(m^2). General: Well developed, well nourished, in no acute distress. Head: Normocephalic, atraumatic, eyes without discharge, sclera non-icteric, nares are congested. Bilateral auditory canals clear, TM's are without perforation, pearly grey with reflective cone of light bilaterally. No sinus TTP. Oral cavity moist, dentition normal. Posterior pharynx with post nasal drip and mild erythema. No peritonsillar abscess or tonsillar exudate. Neck: Supple. No thyromegaly. Full ROM. No lymphadenopathy. Lungs: Coarse breath sounds bilaterally with mild expiratory wheezes. No rales or rhonchi. Breathing is unlabored.  Heart: RRR with S1 S2. No murmurs, rubs, or gallops appreciated. Msk:  Strength and tone normal for age. Extremities: No clubbing or cyanosis. No edema. Neuro: Alert and oriented X 3. Moves all extremities spontaneously. CNII-XII grossly in tact. Psych:  Responds to questions appropriately with a normal affect.     ASSESSMENT AND PLAN:  31 y.o. year old male with bronchitis, bronchospasm, and cough. -Azithromycin 250 MG #6 2 po first day then 1 po next 4  days no RF -Hycodan #4oz 1 tsp po q 4-6 hours prn cough no RF SED -Atrovent NS 0.06% 2 sprays each nare bid prn #1 no RF -Proventil 2 puffs inhaled q 4-6 hours prn #1 no RF -Mucinex -Tylenol/Motrin prn -Rest/fluids -RTC precautions -RTC 3-5 days if no improvement  Signed, Eula Listen, PA-C 05/07/2012 2:23 PM

## 2012-11-08 ENCOUNTER — Ambulatory Visit: Payer: 59

## 2012-11-08 ENCOUNTER — Ambulatory Visit (INDEPENDENT_AMBULATORY_CARE_PROVIDER_SITE_OTHER): Payer: 59 | Admitting: Internal Medicine

## 2012-11-08 VITALS — BP 119/74 | HR 61 | Temp 98.1°F | Resp 16 | Ht 71.0 in | Wt 184.0 lb

## 2012-11-08 DIAGNOSIS — Z8547 Personal history of malignant neoplasm of testis: Secondary | ICD-10-CM

## 2012-11-08 DIAGNOSIS — M549 Dorsalgia, unspecified: Secondary | ICD-10-CM

## 2012-11-08 MED ORDER — MELOXICAM 15 MG PO TABS: 15.0000 mg | ORAL_TABLET | Freq: Every day | ORAL | Status: DC

## 2012-11-08 MED ORDER — CYCLOBENZAPRINE HCL 10 MG PO TABS: 10.0000 mg | ORAL_TABLET | Freq: Every day | ORAL | Status: DC

## 2012-11-08 NOTE — Progress Notes (Signed)
  Subjective:    Patient ID: David Wallace, male    DOB: [birth year], 32 y.o.   MRN: [id number]  HPI 2-3 month history of intermittent low back pain precipitated by mountain bike riding At first was just sore the next day/then started noticing pain two thirds of the way through ride Within the last all the next day or 2 with acute spasms when bending forward. Occasional radiation into the right buttock. No sensory losses or weakness. No genitourinary symptoms No fever  Past Medical History  Diagnosis Date  . Testicular cancer     age 89  . 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 Merla Riches normal lumbosacral spine       Assessment & Plan:  Acute lumbosacral strain  Meds ordered this encounter  Medications  . cyclobenzaprine (FLEXERIL) 10 MG tablet    Sig: Take 1 tablet (10 mg total) by mouth at bedtime.    Dispense:  21 tablet    Refill:  0  . meloxicam (MOBIC) 15 MG tablet    Sig: Take 1 tablet (15 mg total) by mouth daily.    Dispense:  30 tablet    Refill:  0   Exercises to do twice a day involving stretching and strengthening Consider Pilates/better bike posture Call in 3 weeks if not resolved for referral to physical therapy

## 2012-11-09 DIAGNOSIS — Z8547 Personal history of malignant neoplasm of testis: Secondary | ICD-10-CM | POA: Insufficient documentation

## 2013-03-01 ENCOUNTER — Other Ambulatory Visit: Payer: Self-pay | Admitting: Physician Assistant

## 2013-07-19 ENCOUNTER — Ambulatory Visit (INDEPENDENT_AMBULATORY_CARE_PROVIDER_SITE_OTHER): Payer: BC Managed Care – PPO | Admitting: Internal Medicine

## 2013-07-19 VITALS — BP 122/78 | HR 100 | Temp 98.5°F | Resp 16

## 2013-07-19 DIAGNOSIS — J209 Acute bronchitis, unspecified: Secondary | ICD-10-CM

## 2013-07-19 DIAGNOSIS — J111 Influenza due to unidentified influenza virus with other respiratory manifestations: Secondary | ICD-10-CM

## 2013-07-19 DIAGNOSIS — R509 Fever, unspecified: Secondary | ICD-10-CM

## 2013-07-19 LAB — POCT INFLUENZA A/B
INFLUENZA A, POC: NEGATIVE
Influenza B, POC: POSITIVE

## 2013-07-19 MED ORDER — OSELTAMIVIR PHOSPHATE 75 MG PO CAPS: 75.0000 mg | ORAL_CAPSULE | Freq: Two times a day (BID) | ORAL | Status: DC

## 2013-07-19 MED ORDER — HYDROCODONE-ACETAMINOPHEN 7.5-325 MG/15ML PO SOLN: 5.0000 mL | Freq: Four times a day (QID) | ORAL | Status: DC | PRN

## 2013-07-19 MED ORDER — AZITHROMYCIN 500 MG PO TABS: 500.0000 mg | ORAL_TABLET | Freq: Every day | ORAL | Status: DC

## 2013-07-19 NOTE — Patient Instructions (Signed)
Acute Bronchitis Bronchitis is inflammation of the airways that extend from the windpipe into the lungs (bronchi). The inflammation often causes mucus to develop. This leads to a cough, which is the most common symptom of bronchitis.  In acute bronchitis, the condition usually develops suddenly and goes away over time, usually in a couple weeks. Smoking, allergies, and asthma can make bronchitis worse. Repeated episodes of bronchitis may cause further lung problems.  CAUSES Acute bronchitis is most often caused by the same virus that causes a cold. The virus can spread from person to person (contagious).  SIGNS AND SYMPTOMS   Cough.   Fever.   Coughing up mucus.   Body aches.   Chest congestion.   Chills.   Shortness of breath.   Sore throat.  DIAGNOSIS  Acute bronchitis is usually diagnosed through a physical exam. Tests, such as chest X-rays, are sometimes done to rule out other conditions.  TREATMENT  Acute bronchitis usually goes away in a couple weeks. Often times, no medical treatment is necessary. Medicines are sometimes given for relief of fever or cough. Antibiotics are usually not needed but may be prescribed in certain situations. In some cases, an inhaler may be recommended to help reduce shortness of breath and control the cough. A cool mist vaporizer may also be used to help thin bronchial secretions and make it easier to clear the chest.  HOME CARE INSTRUCTIONS  Get plenty of rest.   Drink enough fluids to keep your urine clear or pale yellow (unless you have a medical condition that requires fluid restriction). Increasing fluids may help thin your secretions and will prevent dehydration.   Only take over-the-counter or prescription medicines as directed by your health care provider.   Avoid smoking and secondhand smoke. Exposure to cigarette smoke or irritating chemicals will make bronchitis worse. If you are a smoker, consider using nicotine gum or skin  patches to help control withdrawal symptoms. Quitting smoking will help your lungs heal faster.   Reduce the chances of another bout of acute bronchitis by washing your hands frequently, avoiding people with cold symptoms, and trying not to touch your hands to your mouth, nose, or eyes.   Follow up with your health care provider as directed.  SEEK MEDICAL CARE IF: Your symptoms do not improve after 1 week of treatment.  SEEK IMMEDIATE MEDICAL CARE IF:  You develop an increased fever or chills.   You have chest pain.   You have severe shortness of breath.  You have bloody sputum.   You develop dehydration.  You develop fainting.  You develop repeated vomiting.  You develop a severe headache. MAKE SURE YOU:   Understand these instructions.  Will watch your condition.  Will get help right away if you are not doing well or get worse. Document Released: 06/20/2004 Document Revised: 01/13/2013 Document Reviewed: 11/03/2012 [facility] Patient Information 2014 [place]. Fever, Adult A fever is a higher than normal body temperature. In an adult, an oral temperature around 98.6 F (37 C) is considered normal. A temperature of 100.4 F (38 C) or higher is generally considered a fever. Mild or moderate fevers generally have no long-term effects and often do not require treatment. Extreme fever (greater than or equal to 106 F or 41.1 C) can cause seizures. The sweating that may occur with repeated or prolonged fever may cause dehydration. Elderly people can develop confusion during a fever. A measured temperature can vary with:  Age.  Time of day.  Method  of measurement (mouth, underarm, rectal, or ear). The fever is confirmed by taking a temperature with a thermometer. Temperatures can be taken different ways. Some methods are accurate and some are not.  An oral temperature is used most commonly. Electronic thermometers are fast and accurate.  An ear temperature will  only be accurate if the thermometer is positioned as recommended by the manufacturer.  A rectal temperature is accurate and done for those adults who have a condition where an oral temperature cannot be taken.  An underarm (axillary) temperature is not accurate and not recommended. Fever is a symptom, not a disease.  CAUSES   Infections commonly cause fever.  Some noninfectious causes for fever include:  Some arthritis conditions.  Some thyroid or adrenal gland conditions.  Some immune system conditions.  Some types of cancer.  A medicine reaction.  High doses of certain street drugs such as methamphetamine.  Dehydration.  Exposure to high outside or room temperatures.  Occasionally, the source of a fever cannot be determined. This is sometimes called a "fever of unknown origin" (FUO).  Some situations may lead to a temporary rise in body temperature that may go away on its own. Examples are:  Childbirth.  Surgery.  Intense exercise. HOME CARE INSTRUCTIONS   Take appropriate medicines for fever. Follow dosing instructions carefully. If you use acetaminophen to reduce the fever, be careful to avoid taking other medicines that also contain acetaminophen. Do not take aspirin for a fever if you are younger than age 49. There is an association with Reye's syndrome. Reye's syndrome is a rare but potentially deadly disease.  If an infection is present and antibiotics have been prescribed, take them as directed. Finish them even if you start to feel better.  Rest as needed.  Maintain an adequate fluid intake. To prevent dehydration during an illness with prolonged or recurrent fever, you may need to drink extra fluid.Drink enough fluids to keep your urine clear or pale yellow.  Sponging or bathing with room temperature water may help reduce body temperature. Do not use ice water or alcohol sponge baths.  Dress comfortably, but do not over-bundle. SEEK MEDICAL CARE IF:    You are unable to keep fluids down.  You develop vomiting or diarrhea.  You are not feeling at least partly better after 3 days.  You develop new symptoms or problems. SEEK IMMEDIATE MEDICAL CARE IF:   You have shortness of breath or trouble breathing.  You develop excessive weakness.  You are dizzy or you faint.  You are extremely thirsty or you are making little or no urine.  You develop new pain that was not there before (such as in the head, neck, chest, back, or abdomen).  You have persistant vomiting and diarrhea for more than 1 to 2 days.  You develop a stiff neck or your eyes become sensitive to light.  You develop a skin rash.  You have a fever or persistent symptoms for more than 2 to 3 days.  You have a fever and your symptoms suddenly get worse. MAKE SURE YOU:   Understand these instructions.  Will watch your condition.  Will get help right away if you are not doing well or get worse. Document Released: 11/06/2000 Document Revised: 08/05/2011 Document Reviewed: 03/14/2011 [facility] Patient Information 2014 [place], [place]. Influenza, Adult Influenza ("the flu") is a viral infection of the respiratory tract. It occurs more often in winter months because people spend more time in close contact with one another. Influenza  can make you feel very sick. Influenza easily spreads from person to person (contagious). CAUSES  Influenza is caused by a virus that infects the respiratory tract. You can catch the virus by breathing in droplets from an infected person's cough or sneeze. You can also catch the virus by touching something that was recently contaminated with the virus and then touching your mouth, nose, or eyes. SYMPTOMS  Symptoms typically last 4 to 10 days and may include:  Fever.  Chills.  Headache, body aches, and muscle aches.  Sore throat.  Chest discomfort and cough.  Poor appetite.  Weakness or feeling tired.  Dizziness.  Nausea or  vomiting. DIAGNOSIS  Diagnosis of influenza is often made based on your history and a physical exam. A nose or throat swab test can be done to confirm the diagnosis. RISKS AND COMPLICATIONS You may be at risk for a more severe case of influenza if you smoke cigarettes, have diabetes, have chronic heart disease (such as heart failure) or lung disease (such as asthma), or if you have a weakened immune system. Elderly people and pregnant women are also at risk for more serious infections. The most common complication of influenza is a lung infection (pneumonia). Sometimes, this complication can require emergency medical care and may be life-threatening. PREVENTION  An annual influenza vaccination (flu shot) is the best way to avoid getting influenza. An annual flu shot is now routinely recommended for all adults in the [place] TREATMENT  In mild cases, influenza goes away on its own. Treatment is directed at relieving symptoms. For more severe cases, your caregiver may prescribe antiviral medicines to shorten the sickness. Antibiotic medicines are not effective, because the infection is caused by a virus, not by bacteria. HOME CARE INSTRUCTIONS  Only take over-the-counter or prescription medicines for pain, discomfort, or fever as directed by your caregiver.  Use a cool mist humidifier to make breathing easier.  Get plenty of rest until your temperature returns to normal. This usually takes 3 to 4 days.  Drink enough fluids to keep your urine clear or pale yellow.  Cover your mouth and nose when coughing or sneezing, and wash your hands well to avoid spreading the virus.  Stay home from work or school until your fever has been gone for at least 1 full day. SEEK MEDICAL CARE IF:   You have chest pain or a deep cough that worsens or produces more mucus.  You have nausea, vomiting, or diarrhea. SEEK IMMEDIATE MEDICAL CARE IF:   You have difficulty breathing, shortness of breath, or your skin or  nails turn bluish.  You have severe neck pain or stiffness.  You have a severe headache, facial pain, or earache.  You have a worsening or recurring fever.  You have nausea or vomiting that cannot be controlled. MAKE SURE YOU:  Understand these instructions.  Will watch your condition.  Will get help right away if you are not doing well or get worse. Document Released: 05/10/2000 Document Revised: 11/12/2011 Document Reviewed: 08/12/2011 [facility] Patient Information 2014 [place], [place].

## 2013-07-19 NOTE — Progress Notes (Signed)
   Subjective:    Patient ID: David Wallace, male    DOB: [birth year], 33 y.o.   MRN: [id number]  HPI    Review of Systems     Objective:   Physical Exam        Assessment & Plan:

## 2013-07-19 NOTE — Progress Notes (Signed)
   Subjective:    Patient ID: David Wallace, male    DOB: 01/31/1981, 33 y.o.   MRN: [id number]  HPI 33 y.o. Male presents to clinic with cough for past 3 days . States that he had sinus infection about 3 weeks ago. Cough is productive with thick yellow mucus. Has had chills, body aches mostly in back and neck, headache. Also reports having sore throat, noticed mostly with cough and projecting voice. Reports having severe pain in chest and lungs with coughing. Reports having had some gagging due to cough, but denies any vomiting. States that since yesterday he has had loss of appetite.    Review of Systems phx testicular cancer, cured no set backs    Objective:   Physical Exam  Constitutional: He is oriented to person, place, and time. He appears well-developed and well-nourished. No distress.  HENT:  Right Ear: External ear normal.  Left Ear: External ear normal.  Nose: Mucosal edema, rhinorrhea and sinus tenderness present. Right sinus exhibits no maxillary sinus tenderness and no frontal sinus tenderness. Left sinus exhibits no maxillary sinus tenderness and no frontal sinus tenderness.  Mouth/Throat: Oropharynx is clear and moist.  Eyes: Conjunctivae and EOM are normal. Pupils are equal, round, and reactive to light.  Neck: Normal range of motion. Neck supple. No tracheal deviation present. No thyromegaly present.  Cardiovascular: Normal rate, regular rhythm and normal heart sounds.   Pulmonary/Chest: Effort normal. He has no decreased breath sounds. He has rhonchi. He has no rales. He exhibits tenderness.  Musculoskeletal: Normal range of motion.  Lymphadenopathy:    He has no cervical adenopathy.  Neurological: He is alert and oriented to person, place, and time. No cranial nerve deficit. He exhibits normal muscle tone. Coordination normal.  Skin: He is diaphoretic.  Psychiatric: He has a normal mood and affect. His behavior is normal. Judgment and thought content normal.    Results for orders placed in visit on 07/19/13  POCT INFLUENZA A/B      Result Value Ref Range   Influenza A, POC Negative     Influenza B, POC Positive            Assessment & Plan:  Influenza B positive Tamiflu/Lortab Zithromax 500mg  5d

## 2013-08-09 ENCOUNTER — Telehealth: Payer: Self-pay

## 2013-08-09 NOTE — Telephone Encounter (Signed)
Cough productive, clear still lingering, made him aware he needs to cough this up, and to drink plenty of fluids to keep the mucus thinner. He could try delsym OTC for the cough, but he really needs to continue to cough this up to get it out of his system.

## 2013-08-09 NOTE — Telephone Encounter (Signed)
PT STATES HE STILL HAVE A DEEP COUGH AND WOULD LIKE TO KNOW WHAT CAN HE TAKE TO GET RID OF IT PLEASE CALL [phone]      [facility] [address]

## 2013-12-16 ENCOUNTER — Ambulatory Visit (INDEPENDENT_AMBULATORY_CARE_PROVIDER_SITE_OTHER): Payer: BC Managed Care – PPO | Admitting: Emergency Medicine

## 2013-12-16 VITALS — BP 122/82 | HR 76 | Temp 98.0°F | Resp 18 | Ht 72.0 in | Wt 185.2 lb

## 2013-12-16 DIAGNOSIS — J018 Other acute sinusitis: Secondary | ICD-10-CM

## 2013-12-16 DIAGNOSIS — J209 Acute bronchitis, unspecified: Secondary | ICD-10-CM

## 2013-12-16 MED ORDER — PROMETHAZINE-CODEINE 6.25-10 MG/5ML PO SYRP: 5.0000 mL | ORAL_SOLUTION | Freq: Four times a day (QID) | ORAL | Status: DC | PRN

## 2013-12-16 MED ORDER — PSEUDOEPHEDRINE-GUAIFENESIN ER 60-600 MG PO TB12: 1.0000 | ORAL_TABLET | Freq: Two times a day (BID) | ORAL | Status: DC

## 2013-12-16 MED ORDER — AMOXICILLIN-POT CLAVULANATE 875-125 MG PO TABS: 1.0000 | ORAL_TABLET | Freq: Two times a day (BID) | ORAL | Status: DC

## 2013-12-16 NOTE — Progress Notes (Signed)
[facility] [address], [place] [address] [phone]  Date:  12/16/2013   Name:  David Wallace   DOB:  05-06-1981   MRN:  [id number]  PCP:  Leandrew Koyanagi, MD    Chief Complaint: Sore Throat and Cough   History of Present Illness:  David Wallace is a 33 y.o. very pleasant male patient who presents with the following:  Ill since Saturday with nasal congestion and post nasal drainage.  Has a sore throat and cough productive of purulent sputum.  No wheezing or shortness of breath.  No nausea or vomiting.  Had a low grade fever that responded to OTC meds.  Denies other complaint or health concern today.   Patient Active Problem List   Diagnosis Date Noted  . History of testicular cancer 11/09/2012    Past Medical History  Diagnosis Date  . Testicular cancer     age 35  . HSV-1 (herpes simplex virus 1) infection     Past Surgical History  Procedure Laterality Date  . Orchiectomy    . Wisdom tooth extraction    . Intestinal obstruction  2005    History  Substance Use Topics  . Smoking status: Never Smoker   . Smokeless tobacco: Not on file  . Alcohol Use: 0.5 - 1.0 oz/week    1-2 drink(s) per week    Family History  Problem Relation Age of Onset  . Heart disease Paternal Uncle   . Cancer Maternal Grandmother   . Cancer Maternal Grandfather   . Dementia Paternal Grandmother   . Cancer Paternal Grandfather   . Heart disease Paternal Uncle     No Known Allergies  Medication list has been reviewed and updated.  Current Outpatient Prescriptions on File Prior to Visit  Medication Sig Dispense Refill  . valACYclovir (VALTREX) 1000 MG tablet TAKE 1 TABLET BY MOUTH TWICE A DAY  30 tablet  4  . albuterol (PROVENTIL HFA;VENTOLIN HFA) 108 (90 BASE) MCG/ACT inhaler Inhale 2 puffs into the lungs every 4 (four) hours as needed for wheezing.  1 Inhaler  1  . azithromycin (ZITHROMAX) 500 MG tablet Take 1 tablet (500 mg total) by  mouth daily.  5 tablet  0  . cyclobenzaprine (FLEXERIL) 10 MG tablet Take 1 tablet (10 mg total) by mouth at bedtime.  21 tablet  0  . HYDROcodone-acetaminophen (HYCET) 7.5-325 mg/15 ml solution Take 5 mLs by mouth every 6 (six) hours as needed (or cough).  240 mL  0  . meloxicam (MOBIC) 15 MG tablet Take 1 tablet (15 mg total) by mouth daily.  30 tablet  0  . oseltamivir (TAMIFLU) 75 MG capsule Take 1 capsule (75 mg total) by mouth 2 (two) times daily.  10 capsule  0   No current facility-administered medications on file prior to visit.    Review of Systems:  As per HPI, otherwise negative.    Physical Examination: Filed Vitals:   12/16/13 1044  BP: 122/82  Pulse: 76  Temp: 98 F (36.7 C)  Resp: 18   Filed Vitals:   12/16/13 1044  Height: 6' (1.829 m)  Weight: 185 lb 3.2 oz (84.006 kg)   Body mass index is 25.11 kg/(m^2). Ideal Body Weight: Weight in (lb) to have BMI = 25: 183.9  GEN: WDWN, NAD, Non-toxic, A & O x 3 HEENT: Atraumatic, Normocephalic. Neck supple. No masses, No LAD. Ears and Nose: No external deformity. CV: RRR, No M/G/R. No  JVD. No thrill. No extra heart sounds. PULM: CTA B, no wheezes, crackles, rhonchi. No retractions. No resp. distress. No accessory muscle use. ABD: S, NT, ND, +BS. No rebound. No HSM. EXTR: No c/c/e NEURO Normal gait.  PSYCH: Normally interactive. Conversant. Not depressed or anxious appearing.  Calm demeanor.    Assessment and Plan: Sinusitis Bronchitis augmentin mucinex d Phen c cod  Signed,  Ellison Carwin, MD

## 2013-12-16 NOTE — Patient Instructions (Signed)

## 2014-07-13 ENCOUNTER — Ambulatory Visit (INDEPENDENT_AMBULATORY_CARE_PROVIDER_SITE_OTHER): Payer: BLUE CROSS/BLUE SHIELD | Admitting: Physician Assistant

## 2014-07-13 VITALS — BP 120/72 | HR 79 | Temp 98.2°F | Resp 18 | Ht 72.0 in | Wt 192.0 lb

## 2014-07-13 DIAGNOSIS — J3489 Other specified disorders of nose and nasal sinuses: Secondary | ICD-10-CM

## 2014-07-13 DIAGNOSIS — R059 Cough, unspecified: Secondary | ICD-10-CM

## 2014-07-13 DIAGNOSIS — R0981 Nasal congestion: Secondary | ICD-10-CM

## 2014-07-13 DIAGNOSIS — R05 Cough: Secondary | ICD-10-CM

## 2014-07-13 DIAGNOSIS — J329 Chronic sinusitis, unspecified: Secondary | ICD-10-CM

## 2014-07-13 MED ORDER — BENZONATATE 100 MG PO CAPS: 100.0000 mg | ORAL_CAPSULE | Freq: Three times a day (TID) | ORAL | Status: DC | PRN

## 2014-07-13 NOTE — Progress Notes (Signed)
Subjective:    Patient ID: David Wallace, male    DOB: 1981-03-21, 34 y.o.   MRN: [id number]  Chief Complaint  Patient presents with  . Sinusitis    x2days   . Cough    green mucous   . Headache  . Nasal Congestion   Patient Active Problem List   Diagnosis Date Noted  . History of testicular cancer 11/09/2012   Prior to Admission medications   Medication Sig Start Date End Date Taking? Authorizing Provider  valACYclovir (VALTREX) 1000 MG tablet TAKE 1 TABLET BY MOUTH TWICE A DAY 03/01/13  Yes Eleanore E Egan, PA-C  albuterol (PROVENTIL HFA;VENTOLIN HFA) 108 (90 BASE) MCG/ACT inhaler Inhale 2 puffs into the lungs every 4 (four) hours as needed for wheezing. Patient not taking: Reported on 07/13/2014 05/07/12   Areta Haber Dunn, PA-C  benzonatate (TESSALON) 100 MG capsule Take 1-2 capsules (100-200 mg total) by mouth 3 (three) times daily as needed for cough. 07/13/14   Araceli Bouche, PA   Medications, allergies, past medical history, surgical history, family history, social history and problem list reviewed and updated.  HPI  34 yom with pmh recurrent sinus infections presents with 3 day h/o congestion, rhinorrhea, cough.   Per pt has hx numerous sinus infxs. Was seen here twice in the past year and given abx for infxns. He states he has had several prior to this. He states that if they are not treated they have caused bronchitis and pneumonia in the past. Has never had any sinus imaging.   This episode started 3 days ago with sore throat, head congestion, rhinorrhea. Has persisted. Has had mildly prod yellow/green sputum cough past 2 days. He is leaving for an important work trip to [facility] in 4 days and is concerned this sickness may interfere with that.   Denies fever, abd pain, n/v, diarrhea. Denies otalgia. Denies cp, sob. Has had chills past couple days.   No hx allergies. Has taken mucinex-d which helped. Advil helped for st.   Review of Systems See HPI.     Objective:   Physical Exam  Constitutional: He appears well-developed and well-nourished.  Non-toxic appearance. He does not have a sickly appearance. He does not appear ill. No distress.  BP 120/72 mmHg  Pulse 79  Temp(Src) 98.2 F (36.8 C) (Oral)  Resp 18  Ht 6' (1.829 m)  Wt 192 lb (87.091 kg)  BMI 26.03 kg/m2  SpO2 99%   HENT:  Right Ear: Tympanic membrane normal.  Left Ear: Tympanic membrane normal.  Nose: Mucosal edema and rhinorrhea present. Right sinus exhibits no maxillary sinus tenderness and no frontal sinus tenderness. Left sinus exhibits no maxillary sinus tenderness and no frontal sinus tenderness.  Mouth/Throat: Uvula is midline, oropharynx is clear and moist and mucous membranes are normal. No oropharyngeal exudate, posterior oropharyngeal edema, posterior oropharyngeal erythema or tonsillar abscesses.  Right TM with scarring from having tubes as child.   Eyes: Conjunctivae and EOM are normal.  Neck: No Brudzinski's sign noted.  Pulmonary/Chest: Effort normal and breath sounds normal. He has no decreased breath sounds. He has no wheezes. He has no rhonchi. He has no rales.  Lymphadenopathy:       Head (right side): No submental, no submandibular and no tonsillar adenopathy present.       Head (left side): No submental, no submandibular and no tonsillar adenopathy present.    He has no cervical adenopathy.      Assessment & Plan:  34 yom with pmh recurrent sinus infections presents with 3 day h/o congestion, rhinorrhea, cough.   Head congestion Sinus pressure Cough - Plan: benzonatate (TESSALON) 100 MG capsule Recurrent sinusitis - Plan: Ambulatory referral to ENT --doubt bacterial infx at this time as vitals normal, exam normal --most likely viral uri causing post nasal drip --mucinex-d, atrovent, fluids, humidifier, tessalon, tylenol, rest  --call if not improved one week or if fever, or chills persist --referral to ent for recurrent sinus probs leading to numerous  infnxs  Julieta Gutting, PA-C Physician Assistant-Certified [facility]  07/13/2014 2:19 PM

## 2014-07-13 NOTE — Patient Instructions (Signed)
I don't think your symptoms are due to a bacterial infection at this point.  For the sore throat, tylenol, fluids, lozenges, and a humidifier at night will help. For the congestion, continuing to use the mucinex-d with plenty of water will help.  A nasal spray such as atrovent or flonase may help with the nasal congestion.  For the cough, tessalon may help. You can take your cough medication with codeine at night as needed.  If you're not feeling better in one week, or if you start having fevers/chills please let me know.  If you start feeling worse, please come back to clinic sooner or go to the ED.

## 2014-07-16 ENCOUNTER — Telehealth: Payer: Self-pay

## 2014-07-16 NOTE — Telephone Encounter (Signed)
Patient called in today stating that he came in this week and saw Araceli Bouche and that Sherren Mocha had told him to call back Saturday if he was not better and he would send him in an antibiotic.    He uses the [facility] on [address] and his call back number is [phone]

## 2014-07-19 NOTE — Telephone Encounter (Signed)
Left msg with pt to see is he still is feeling sick or if improved since has been 3 days since his call.

## 2014-07-19 NOTE — Telephone Encounter (Signed)
Recurrent sinusitis - Plan: Ambulatory referral to ENT --doubt bacterial infx at this time as vitals normal, exam normal --most likely viral uri causing post nasal drip --mucinex-d, atrovent, fluids, humidifier, tessalon, tylenol, rest  --call if not improved one week or if fever, or chills persist --referral to ent for recurrent sinus probs leading to numerous infnxs  Julieta Gutting, PA-C  Referral or ABX? Please advise.

## 2015-04-24 ENCOUNTER — Encounter: Payer: Self-pay | Admitting: Internal Medicine

## 2015-08-02 ENCOUNTER — Other Ambulatory Visit: Payer: Self-pay

## 2015-08-02 MED ORDER — VALACYCLOVIR HCL 1 G PO TABS: 1000.0000 mg | ORAL_TABLET | Freq: Two times a day (BID) | ORAL | Status: DC

## 2015-08-03 ENCOUNTER — Other Ambulatory Visit: Payer: Self-pay

## 2015-08-03 MED ORDER — VALACYCLOVIR HCL 1 G PO TABS: 1000.0000 mg | ORAL_TABLET | Freq: Two times a day (BID) | ORAL | Status: DC

## 2015-12-14 ENCOUNTER — Ambulatory Visit (INDEPENDENT_AMBULATORY_CARE_PROVIDER_SITE_OTHER): Payer: BLUE CROSS/BLUE SHIELD

## 2015-12-14 ENCOUNTER — Ambulatory Visit (INDEPENDENT_AMBULATORY_CARE_PROVIDER_SITE_OTHER): Payer: BLUE CROSS/BLUE SHIELD | Admitting: Family Medicine

## 2015-12-14 ENCOUNTER — Encounter: Payer: Self-pay | Admitting: Family Medicine

## 2015-12-14 VITALS — BP 102/62 | HR 75 | Temp 98.1°F | Ht 72.0 in

## 2015-12-14 DIAGNOSIS — Z8249 Family history of ischemic heart disease and other diseases of the circulatory system: Secondary | ICD-10-CM

## 2015-12-14 DIAGNOSIS — R079 Chest pain, unspecified: Secondary | ICD-10-CM

## 2015-12-14 DIAGNOSIS — R0600 Dyspnea, unspecified: Secondary | ICD-10-CM | POA: Diagnosis not present

## 2015-12-14 LAB — POCT SEDIMENTATION RATE: POCT SED RATE: 6 mm/h (ref 0–22)

## 2015-12-14 LAB — D-DIMER, QUANTITATIVE (NOT AT ARMC)

## 2015-12-14 NOTE — Patient Instructions (Addendum)
Your EKG and chest x-ray appear reassuring today. With some of the soreness in the muscle and chest wall, this is less likely heart issue. See information below on chest pain and chest wall pain. I will check blood clot test and other inflammation test, and will let you know the results of that today.   For now can take over-the-counter Aleve or ibuprofen, take it easy the next few days and avoid heavy lifting twisting or turning, but if any increased pain, shortness of breath, or worsening of your symptoms, proceed to the emergency room. Let me know if you have questions in the meantime  Nonspecific Chest Pain  Chest pain can be caused by many different conditions. There is always a chance that your pain could be related to something serious, such as a heart attack or a blood clot in your lungs. Chest pain can also be caused by conditions that are not life-threatening. If you have chest pain, it is very important to follow up with your health care provider. CAUSES  Chest pain can be caused by:  Heartburn.  Pneumonia or bronchitis.  Anxiety or stress.  Inflammation around your heart (pericarditis) or lung (pleuritis or pleurisy).  A blood clot in your lung.  A collapsed lung (pneumothorax). It can develop suddenly on its own (spontaneous pneumothorax) or from trauma to the chest.  Shingles infection (varicella-zoster virus).  Heart attack.  Damage to the bones, muscles, and cartilage that make up your chest wall. This can include:  Bruised bones due to injury.  Strained muscles or cartilage due to frequent or repeated coughing or overwork.  Fracture to one or more ribs.  Sore cartilage due to inflammation (costochondritis). RISK FACTORS  Risk factors for chest pain may include:  Activities that increase your risk for trauma or injury to your chest.  Respiratory infections or conditions that cause frequent coughing.  Medical conditions or overeating that can cause  heartburn.  Heart disease or family history of heart disease.  Conditions or health behaviors that increase your risk of developing a blood clot.  Having had chicken pox (varicella zoster). SIGNS AND SYMPTOMS Chest pain can feel like:  Burning or tingling on the surface of your chest or deep in your chest.  Crushing, pressure, aching, or squeezing pain.  Dull or sharp pain that is worse when you move, cough, or take a deep breath.  Pain that is also felt in your back, neck, shoulder, or arm, or pain that spreads to any of these areas. Your chest pain may come and go, or it may stay constant. DIAGNOSIS Lab tests or other studies may be needed to find the cause of your pain. Your health care provider may have you take a test called an ambulatory ECG (electrocardiogram). An ECG records your heartbeat patterns at the time the test is performed. You may also have other tests, such as:  Transthoracic echocardiogram (TTE). During echocardiography, sound waves are used to create a picture of all of the heart structures and to look at how blood flows through your heart.  Transesophageal echocardiogram (TEE).This is a more advanced imaging test that obtains images from inside your body. It allows your health care provider to see your heart in finer detail.  Cardiac monitoring. This allows your health care provider to monitor your heart rate and rhythm in real time.  Holter monitor. This is a portable device that records your heartbeat and can help to diagnose abnormal heartbeats. It allows your health care provider to  track your heart activity for several days, if needed.  Stress tests. These can be done through exercise or by taking medicine that makes your heart beat more quickly.  Blood tests.  Imaging tests. TREATMENT  Your treatment depends on what is causing your chest pain. Treatment may include:  Medicines. These may include:  Acid blockers for heartburn.  Anti-inflammatory  medicine.  Pain medicine for inflammatory conditions.  Antibiotic medicine, if an infection is present.  Medicines to dissolve blood clots.  Medicines to treat coronary artery disease.  Supportive care for conditions that do not require medicines. This may include:  Resting.  Applying heat or cold packs to injured areas.  Limiting activities until pain decreases. HOME CARE INSTRUCTIONS  If you were prescribed an antibiotic medicine, finish it all even if you start to feel better.  Avoid any activities that bring on chest pain.  Do not use any tobacco products, including cigarettes, chewing tobacco, or electronic cigarettes. If you need help quitting, ask your health care provider.  Do not drink alcohol.  Take medicines only as directed by your health care provider.  Keep all follow-up visits as directed by your health care provider. This is important. This includes any further testing if your chest pain does not go away.  If heartburn is the cause for your chest pain, you may be told to keep your head raised (elevated) while sleeping. This reduces the chance that acid will go from your stomach into your esophagus.  Make lifestyle changes as directed by your health care provider. These may include:  Getting regular exercise. Ask your health care provider to suggest some activities that are safe for you.  Eating a heart-healthy diet. A registered dietitian can help you to learn healthy eating options.  Maintaining a healthy weight.  Managing diabetes, if necessary.  Reducing stress. SEEK MEDICAL CARE IF:  Your chest pain does not go away after treatment.  You have a rash with blisters on your chest.  You have a fever. SEEK IMMEDIATE MEDICAL CARE IF:   Your chest pain is worse.  You have an increasing cough, or you cough up blood.  You have severe abdominal pain.  You have severe weakness.  You faint.  You have chills.  You have sudden, unexplained chest  discomfort.  You have sudden, unexplained discomfort in your arms, back, neck, or jaw.  You have shortness of breath at any time.  You suddenly start to sweat, or your skin gets clammy.  You feel nauseous or you vomit.  You suddenly feel light-headed or dizzy.  Your heart begins to beat quickly, or it feels like it is skipping beats. These symptoms may represent a serious problem that is an emergency. Do not wait to see if the symptoms will go away. Get medical help right away. Call your local emergency services (911 in the [place] Do not drive yourself to the hospital.   This information is not intended to replace advice given to you by your health care provider. Make sure you discuss any questions you have with your health care provider.   Document Released: 02/20/2005 Document Revised: 06/03/2014 Document Reviewed: 12/17/2013 Elsevier Interactive Patient Education 2016 [employer] Chest Wall Pain Chest wall pain is pain in or around the bones and muscles of your chest. Sometimes, an injury causes this pain. Sometimes, the cause may not be known. This pain may take several weeks or longer to get better. HOME CARE INSTRUCTIONS  Pay attention to any changes in  your symptoms. Take these actions to help with your pain:   Rest as told by your health care provider.   Avoid activities that cause pain. These include any activities that use your chest muscles or your abdominal and side muscles to lift heavy items.   If directed, apply ice to the painful area:  Put ice in a plastic bag.  Place a towel between your skin and the bag.  Leave the ice on for 20 minutes, 2-3 times per day.  Take over-the-counter and prescription medicines only as told by your health care provider.  Do not use tobacco products, including cigarettes, chewing tobacco, and e-cigarettes. If you need help quitting, ask your health care provider.  Keep all follow-up visits as told by your health care  provider. This is important. SEEK MEDICAL CARE IF:  You have a fever.  Your chest pain becomes worse.  You have new symptoms. SEEK IMMEDIATE MEDICAL CARE IF:  You have nausea or vomiting.  You feel sweaty or light-headed.  You have a cough with phlegm (sputum) or you cough up blood.  You develop shortness of breath.   This information is not intended to replace advice given to you by your health care provider. Make sure you discuss any questions you have with your health care provider.   Document Released: 05/13/2005 Document Revised: 02/01/2015 Document Reviewed: 08/08/2014 Elsevier Interactive Patient Education 2016 [employer].    IF you received an x-ray today, you will receive an invoice from [facility]. Please contact [facility] at [phone] with questions or concerns regarding your invoice.   IF you received labwork today, you will receive an invoice from [employer]. Please contact [facility] at [phone] with questions or concerns regarding your invoice.   Our billing staff will not be able to assist you with questions regarding bills from these companies.  You will be contacted with the lab results as soon as they are available. The fastest way to get your results is to activate your My Chart account. Instructions are located on the last page of this paperwork. If you have not heard from [place] regarding the results in 2 weeks, please contact this office.

## 2015-12-14 NOTE — Progress Notes (Signed)
By signing my name below, I, Mesha Guinyard, attest that this documentation has been prepared under the direction and in the presence of Merri Ray, MD.  Electronically Signed: Verlee Monte, Medical Scribe. 12/14/2015. 9:33 AM.  Subjective:    Patient ID: David Wallace, male    DOB: [birth year], 35 y.o.   MRN: [id number]  HPI Chief Complaint  Patient presents with  . Chest Pain    mid sternal area & right sternal area  . Shortness of Breath    symptoms started 45 min prior to arrival    HPI Comments: David Wallace is a 35 y.o. male who presents to the [facility] complaining of sudden chest pain onset around 9 am. Pulled back acutely To room 6 due to chest pain. I was asked to see him and EKG was ordered.   Pt states the pain radiates to the back and had some tightness in his muscles.  Started earlier this morning without any specific activity. Pain was associated with SOB. Pt denies sweating, nausea, or vomiting. Pain was noted this morning with acute onset 8/10 substernal right sided pain. Pain is currently rated a 6/10.  Pt states the pain occurred after he was sitting down- he thought it was indigestion. Pt currently feels some discomfort with breathing. Pt has never had this pain before. Pt never had pneumothorax. Pt did more physical extertion than normal yesterday- remodeling in a house and did some heavy lifting. Pt didn't feel anything last night. Pt states this is a stressful time since he's waiting to hear back for a big job promotion, but he doesn't feel stressed out. Pt states he doesn't normally get stressed out. Approximatel 12 years ago he had chemotherapy for testicular cancer. Does have some soreness on the right front side of his chest near his sternum  FHx: Pt's 21 month old was diagnosed with bicuspid valve  Patient Active Problem List   Diagnosis Date Noted  . History of testicular cancer 11/09/2012   Past Medical History    Diagnosis Date  . Testicular cancer [facility])     age 78  . HSV-1 (herpes simplex virus 1) infection    Past Surgical History  Procedure Laterality Date  . Orchiectomy    . Wisdom tooth extraction    . Intestinal obstruction  2005   No Known Allergies Prior to Admission medications   Medication Sig Start Date End Date Taking? Authorizing Provider  albuterol (PROVENTIL HFA;VENTOLIN HFA) 108 (90 BASE) MCG/ACT inhaler Inhale 2 puffs into the lungs every 4 (four) hours as needed for wheezing. Patient not taking: Reported on 07/13/2014 05/07/12   Areta Haber Dunn, PA-C  benzonatate (TESSALON) 100 MG capsule Take 1-2 capsules (100-200 mg total) by mouth 3 (three) times daily as needed for cough. 07/13/14   Araceli Bouche, PA  valACYclovir (VALTREX) 1000 MG tablet Take 1 tablet (1,000 mg total) by mouth 2 (two) times daily. 08/03/15   Jaynee Eagles, PA-C   Social History   Social History  . Marital Status: Married    Spouse Name: N/A  . Number of Children: N/A  . Years of Education: N/A   Occupational History  . Not on file.   Social History Main Topics  . Smoking status: Never Smoker   . Smokeless tobacco: Not on file  . Alcohol Use: 0.5 - 1.0 oz/week    1-2 Standard drinks or equivalent per week  . Drug Use: No  . Sexual Activity: Not on file  Other Topics Concern  . Not on file   Social History Narrative   Review of Systems  Respiratory: Positive for shortness of breath.   Cardiovascular: Positive for chest pain. Negative for leg swelling (calf).   Objective:  BP 102/62 mmHg  Pulse 75  Temp(Src) 98.1 F (36.7 C) (Oral)  Ht 6' (1.829 m)  SpO2 100%  Physical Exam  Constitutional: He is oriented to person, place, and time. He appears well-developed and well-nourished.  HENT:  Head: Normocephalic and atraumatic.  Eyes: EOM are normal. Pupils are equal, round, and reactive to light.  Neck: No JVD present. Carotid bruit is not present.  Cardiovascular: Normal rate, regular rhythm  and normal heart sounds.   No murmur heard. Pulmonary/Chest: Effort normal and breath sounds normal. No respiratory distress. He has no wheezes. He has no rales.  Musculoskeletal: He exhibits no edema (no calf swelling).  Right upper back: teneder along the rhomboids and spasms Calf non tender Negative homans  Neurological: He is alert and oriented to person, place, and time.  Skin: Skin is warm and dry.  Psychiatric: He has a normal mood and affect.  Vitals reviewed.  EKG Reading: Sinus rhythm. Rate 68. No acute findings.  Dg Chest 2 View  12/14/2015  CLINICAL DATA:  Acute onset of chest pain and shortness of breath this morning, evaluate for pneumothorax. EXAM: CHEST  2 VIEW COMPARISON:  CT scan of the chest of March 11, 2010 and PA and lateral chest x-ray of May 26, 2007. FINDINGS: The lungs are adequately inflated and clear. There is no pneumothorax or pleural effusion. The heart and pulmonary vascularity are normal. The mediastinum is normal in width. The trachea is midline. The bony thorax exhibits no acute abnormality. IMPRESSION: There is no pneumothorax, pneumonia, nor other acute cardiopulmonary abnormality. Electronically Signed   By: David  Martinique M.D.   On: 12/14/2015 09:54   Results for orders placed or performed in visit on 12/14/15  D-dimer, quantitative (not at [facility])  Result Value Ref Range   D-Dimer, Quant <0.19 <0.50 mcg/mL FEU  POCT SEDIMENTATION RATE  Result Value Ref Range   POCT SED RATE 6 0 - 22 mm/hr   Assessment & Plan:  David Wallace is a 35 y.o. male Chest pain, unspecified chest pain type - Plan: EKG 12-Lead, DG Chest 2 View, D-dimer, quantitative (not at [facility]), POCT SEDIMENTATION RATE, Ambulatory referral to Cardiology  Dyspnea - Plan: EKG 12-Lead, DG Chest 2 View, Ambulatory referral to Cardiology  Family history of heart disease - Plan: Ambulatory referral to Cardiology  Nonspecific nonexertional chest pain onset in the morning. Some  musculoskeletal component/chest wall component likely with the spasm in his upper back muscles and tenderness along the right sternum. No known specific DVT/PE risk factors recently, but remote history of testicular cancer.  - D-dimer was reassuring so less likely PE.   - reassuring sedimentation rate, so less likely pericarditis  -EKG and chest x-ray both reassuring, less likely cardiac disease or pneumothorax.   -Symptomatic care discussed with NSAID, RTC/ER precautions if persistent or any acute worsening.  -With family history of cardiac disease, and son with bicuspid aortic valve, will have followed up with cardiology to decide on echo or stress testing/other testing.   No orders of the defined types were placed in this encounter.   Patient Instructions   Your EKG and chest x-ray appear reassuring today. With some of the soreness in the muscle and chest wall, this is less likely heart  issue. See information below on chest pain and chest wall pain. I will check blood clot test and other inflammation test, and will let you know the results of that today.   For now can take over-the-counter Aleve or ibuprofen, take it easy the next few days and avoid heavy lifting twisting or turning, but if any increased pain, shortness of breath, or worsening of your symptoms, proceed to the emergency room. Let me know if you have questions in the meantime  Nonspecific Chest Pain  Chest pain can be caused by many different conditions. There is always a chance that your pain could be related to something serious, such as a heart attack or a blood clot in your lungs. Chest pain can also be caused by conditions that are not life-threatening. If you have chest pain, it is very important to follow up with your health care provider. CAUSES  Chest pain can be caused by:  Heartburn.  Pneumonia or bronchitis.  Anxiety or stress.  Inflammation around your heart (pericarditis) or lung (pleuritis or pleurisy).  A  blood clot in your lung.  A collapsed lung (pneumothorax). It can develop suddenly on its own (spontaneous pneumothorax) or from trauma to the chest.  Shingles infection (varicella-zoster virus).  Heart attack.  Damage to the bones, muscles, and cartilage that make up your chest wall. This can include:  Bruised bones due to injury.  Strained muscles or cartilage due to frequent or repeated coughing or overwork.  Fracture to one or more ribs.  Sore cartilage due to inflammation (costochondritis). RISK FACTORS  Risk factors for chest pain may include:  Activities that increase your risk for trauma or injury to your chest.  Respiratory infections or conditions that cause frequent coughing.  Medical conditions or overeating that can cause heartburn.  Heart disease or family history of heart disease.  Conditions or health behaviors that increase your risk of developing a blood clot.  Having had chicken pox (varicella zoster). SIGNS AND SYMPTOMS Chest pain can feel Wallace:  Burning or tingling on the surface of your chest or deep in your chest.  Crushing, pressure, aching, or squeezing pain.  Dull or sharp pain that is worse when you move, cough, or take a deep breath.  Pain that is also felt in your back, neck, shoulder, or arm, or pain that spreads to any of these areas. Your chest pain may come and go, or it may stay constant. DIAGNOSIS Lab tests or other studies may be needed to find the cause of your pain. Your health care provider may have you take a test called an ambulatory ECG (electrocardiogram). An ECG records your heartbeat patterns at the time the test is performed. You may also have other tests, such as:  Transthoracic echocardiogram (TTE). During echocardiography, sound waves are used to create a picture of all of the heart structures and to look at how blood flows through your heart.  Transesophageal echocardiogram (TEE).This is a more advanced imaging test  that obtains images from inside your body. It allows your health care provider to see your heart in finer detail.  Cardiac monitoring. This allows your health care provider to monitor your heart rate and rhythm in real time.  Holter monitor. This is a portable device that records your heartbeat and can help to diagnose abnormal heartbeats. It allows your health care provider to track your heart activity for several days, if needed.  Stress tests. These can be done through exercise or by taking medicine that  makes your heart beat more quickly.  Blood tests.  Imaging tests. TREATMENT  Your treatment depends on what is causing your chest pain. Treatment may include:  Medicines. These may include:  Acid blockers for heartburn.  Anti-inflammatory medicine.  Pain medicine for inflammatory conditions.  Antibiotic medicine, if an infection is present.  Medicines to dissolve blood clots.  Medicines to treat coronary artery disease.  Supportive care for conditions that do not require medicines. This may include:  Resting.  Applying heat or cold packs to injured areas.  Limiting activities until pain decreases. HOME CARE INSTRUCTIONS  If you were prescribed an antibiotic medicine, finish it all even if you start to feel better.  Avoid any activities that bring on chest pain.  Do not use any tobacco products, including cigarettes, chewing tobacco, or electronic cigarettes. If you need help quitting, ask your health care provider.  Do not drink alcohol.  Take medicines only as directed by your health care provider.  Keep all follow-up visits as directed by your health care provider. This is important. This includes any further testing if your chest pain does not go away.  If heartburn is the cause for your chest pain, you may be told to keep your head raised (elevated) while sleeping. This reduces the chance that acid will go from your stomach into your esophagus.  Make  lifestyle changes as directed by your health care provider. These may include:  Getting regular exercise. Ask your health care provider to suggest some activities that are safe for you.  Eating a heart-healthy diet. A registered dietitian can help you to learn healthy eating options.  Maintaining a healthy weight.  Managing diabetes, if necessary.  Reducing stress. SEEK MEDICAL CARE IF:  Your chest pain does not go away after treatment.  You have a rash with blisters on your chest.  You have a fever. SEEK IMMEDIATE MEDICAL CARE IF:   Your chest pain is worse.  You have an increasing cough, or you cough up blood.  You have severe abdominal pain.  You have severe weakness.  You faint.  You have chills.  You have sudden, unexplained chest discomfort.  You have sudden, unexplained discomfort in your arms, back, neck, or jaw.  You have shortness of breath at any time.  You suddenly start to sweat, or your skin gets clammy.  You feel nauseous or you vomit.  You suddenly feel light-headed or dizzy.  Your heart begins to beat quickly, or it feels Wallace it is skipping beats. These symptoms may represent a serious problem that is an emergency. Do not wait to see if the symptoms will go away. Get medical help right away. Call your local emergency services (911 in the [place] Do not drive yourself to the hospital.   This information is not intended to replace advice given to you by your health care provider. Make sure you discuss any questions you have with your health care provider.   Document Released: 02/20/2005 Document Revised: 06/03/2014 Document Reviewed: 12/17/2013 Elsevier Interactive Patient Education 2016 Elsevier Inc. Chest Wall Pain Chest wall pain is pain in or around the bones and muscles of your chest. Sometimes, an injury causes this pain. Sometimes, the cause may not be known. This pain may take several weeks or longer to get better. HOME CARE INSTRUCTIONS    Pay attention to any changes in your symptoms. Take these actions to help with your pain:   Rest as told by your health care provider.  Avoid activities that cause pain. These include any activities that use your chest muscles or your abdominal and side muscles to lift heavy items.   If directed, apply ice to the painful area:  Put ice in a plastic bag.  Place a towel between your skin and the bag.  Leave the ice on for 20 minutes, 2-3 times per day.  Take over-the-counter and prescription medicines only as told by your health care provider.  Do not use tobacco products, including cigarettes, chewing tobacco, and e-cigarettes. If you need help quitting, ask your health care provider.  Keep all follow-up visits as told by your health care provider. This is important. SEEK MEDICAL CARE IF:  You have a fever.  Your chest pain becomes worse.  You have new symptoms. SEEK IMMEDIATE MEDICAL CARE IF:  You have nausea or vomiting.  You feel sweaty or light-headed.  You have a cough with phlegm (sputum) or you cough up blood.  You develop shortness of breath.   This information is not intended to replace advice given to you by your health care provider. Make sure you discuss any questions you have with your health care provider.   Document Released: 05/13/2005 Document Revised: 02/01/2015 Document Reviewed: 08/08/2014 Elsevier Interactive Patient Education 2016 [employer].    IF you received an x-ray today, you will receive an invoice from [facility]. Please contact [facility] at [phone] with questions or concerns regarding your invoice.   IF you received labwork today, you will receive an invoice from [employer]. Please contact [facility] at [phone] with questions or concerns regarding your invoice.   Our billing staff will not be able to assist you with questions regarding bills from these companies.  You will be  contacted with the lab results as soon as they are available. The fastest way to get your results is to activate your My Chart account. Instructions are located on the last page of this paperwork. If you have not heard from [place] regarding the results in 2 weeks, please contact this office.         I personally performed the services described in this documentation, which was scribed in my presence. The recorded information has been reviewed and considered, and addended by me as needed.   Signed,   Merri Ray, MD [facility].  12/15/2015 12:55 PM

## 2016-06-28 ENCOUNTER — Ambulatory Visit: Payer: BLUE CROSS/BLUE SHIELD | Admitting: Cardiovascular Disease

## 2016-08-27 ENCOUNTER — Encounter: Payer: Self-pay | Admitting: Cardiovascular Disease

## 2016-09-11 ENCOUNTER — Encounter: Payer: Self-pay | Admitting: Cardiovascular Disease

## 2016-09-11 ENCOUNTER — Ambulatory Visit (INDEPENDENT_AMBULATORY_CARE_PROVIDER_SITE_OTHER): Payer: BLUE CROSS/BLUE SHIELD | Admitting: Cardiovascular Disease

## 2016-09-11 ENCOUNTER — Encounter (INDEPENDENT_AMBULATORY_CARE_PROVIDER_SITE_OTHER): Payer: Self-pay

## 2016-09-11 VITALS — BP 102/64 | HR 84 | Ht 72.0 in | Wt 185.8 lb

## 2016-09-11 DIAGNOSIS — Z8249 Family history of ischemic heart disease and other diseases of the circulatory system: Secondary | ICD-10-CM | POA: Diagnosis not present

## 2016-09-11 NOTE — Progress Notes (Signed)
Cardiology Office Note   Date:  09/11/2016   ID:  David Wallace, DOB 04-21-1981, MRN [id number]  PCP:  David Koyanagi, MD (Inactive)  Cardiologist:   David Moores, MD   Chief Complaint  Patient presents with  . Chest Pain   Problem List 1. Chest pain  2. Testicular cancer - age 36   History of Present Illness:  David Wallace is a 36 y.o. male who is being seen today .   He has a family History of cardiac disease. [facility]. Used to see Dr. Marko Stai who just recently retired.   Very healthy. He lives a very active lifestyle. He eats healthy diet. Has not been told that he has a murmur  ( son has a bicuspid AV )   [name] exercises- lives a relatively healthy lifestyle  Owns a [employer]    Pt has a hx of cardiac disease Father died of CAD at age 60.      His 19 yo son was recently found to have a bicuspid AV     Past Medical History:  Diagnosis Date  . HSV-1 (herpes simplex virus 1) infection   . Testicular cancer [facility])    age 28    Past Surgical History:  Procedure Laterality Date  . intestinal obstruction  2005  . ORCHIECTOMY    . WISDOM TOOTH EXTRACTION       Current Outpatient Prescriptions  Medication Sig Dispense Refill  . valACYclovir (VALTREX) 1000 MG tablet Take 1 tablet (1,000 mg total) by mouth 2 (two) times daily. 15 tablet 0   No current facility-administered medications for this visit.     PAD Screen 09/11/2016  Previous PAD dx? No  Previous surgical procedure? No  Pain with walking? No  Feet/toe relief with dangling? No  Painful, non-healing ulcers? No  Extremities discolored? No      Allergies:   Patient has no known allergies.    Social History:  The patient  reports that he has never smoked. He has never used smokeless tobacco. He reports that he drinks about 0.5 - 1.0 oz of alcohol per week . He reports that he does not use drugs.   Family  History:  The patient's family history includes Arrhythmia in his maternal grandmother; Cancer in his maternal grandfather, maternal grandmother, and paternal grandfather; Dementia in his paternal grandmother; Heart attack in his father; Heart disease in his paternal grandmother, paternal uncle, and paternal uncle.    ROS:  Please see the history of present illness.    Review of Systems: Constitutional:  denies fever, chills, diaphoresis, appetite change and fatigue.  HEENT: denies photophobia, eye pain, redness, hearing loss, ear pain, congestion, sore throat, rhinorrhea, sneezing, neck pain, neck stiffness and tinnitus.  Respiratory: denies SOB, DOE, cough, chest tightness, and wheezing.  Cardiovascular: denies chest pain, palpitations and leg swelling.  Gastrointestinal: denies nausea, vomiting, abdominal pain, diarrhea, constipation, blood in stool.  Genitourinary: denies dysuria, urgency, frequency, hematuria, flank pain and difficulty urinating.  Musculoskeletal: denies  myalgias, back pain, joint swelling, arthralgias and gait problem.   Skin: denies pallor, rash and wound.  Neurological: denies dizziness, seizures, syncope, weakness, light-headedness, numbness and headaches.   Hematological: denies adenopathy, easy bruising, personal or family bleeding history.  Psychiatric/ Behavioral: denies suicidal ideation, mood changes, confusion, nervousness, sleep disturbance and agitation.       All other systems are reviewed and negative.  PHYSICAL EXAM: VS:  BP 102/64 (BP Location: Right Arm, Cuff Size: Normal)   Pulse 84   Ht 6' (1.829 m)   Wt 185 lb 12.8 oz (84.3 kg)   SpO2 99%   BMI 25.20 kg/m  , BMI Body mass index is 25.2 kg/m. GEN: Well nourished, well developed, in no acute distress  HEENT: normal  Neck: no JVD, carotid bruits, or masses Cardiac: RRR; no murmurs, rubs, or gallops,no edema  Respiratory:  clear to auscultation bilaterally, normal work of breathing GI:  soft, nontender, nondistended, + BS MS: no deformity or atrophy  Skin: warm and dry, no rash Neuro:  Strength and sensation are intact Psych: normal   EKG:  EKG is ordered today. The ekg ordered today demonstrates NSR at 84.   Normal ECG    Recent Labs: No results found for requested labs within last 8760 hours.    Lipid Panel    Component Value Date/Time   CHOL 199 12/09/2011 1533   TRIG 89 12/09/2011 1533   HDL 43 12/09/2011 1533   CHOLHDL 4.6 12/09/2011 1533   VLDL 18 12/09/2011 1533   LDLCALC 138 (H) 12/09/2011 1533      Wt Readings from Last 3 Encounters:  09/11/16 185 lb 12.8 oz (84.3 kg)  07/13/14 192 lb (87.1 kg)  12/16/13 185 lb 3.2 oz (84 kg)      Other studies Reviewed: Additional studies/ records that were reviewed today include: . Review of the above records demonstrates:    ASSESSMENT AND PLAN:  1.  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   No significant murmur to suggest that he has a bicuspid AV He will follow up with [place] in about 5 years.  I've asked him to let me know if he has any chest pains or shortness breath or Chills Leading Has a Murmur. He Has Normal Flow Murmur but Does Not Have Any Significant Pathologic Murmur on Exam Today.   2. Family hx of CAD:   Father died at age 76 of CAD. Merrilee Seashore would Wallace to have his lipids and electrolytes measured.      Current medicines are reviewed at length with the patient today.  The patient does not have concerns regarding medicines.  Labs/ tests ordered today include:  No orders of the defined types were placed in this encounter.    Disposition:   FU with me as needed.      David Moores, MD  09/11/2016 4:27 PM    [address], [place], [place]  [address] Phone: [phone]; Fax: [phone]

## 2016-09-11 NOTE — Patient Instructions (Signed)
Medication Instructions:  Your physician recommends that you continue on your current medications as directed. Please refer to the Current Medication list given to you today.   Labwork: Your physician recommends that you return for lab work in: tomorrow (Thursday 4/19) anytime between 7:30 am and 4:30 pm You will need to fast for this appointment (nothing by mouth after midnight except black coffee or water)   Testing/Procedures: None Ordered   Follow-Up: Your physician recommends that you schedule a follow-up appointment in: as needed with Dr. Acie Fredrickson   If you need a refill on your cardiac medications before your next appointment, please call your pharmacy.   Thank you for choosing [facility]! Christen Bame, RN [phone]

## 2016-09-12 ENCOUNTER — Other Ambulatory Visit: Payer: BLUE CROSS/BLUE SHIELD | Admitting: *Deleted

## 2016-09-12 DIAGNOSIS — Z8249 Family history of ischemic heart disease and other diseases of the circulatory system: Secondary | ICD-10-CM

## 2016-09-13 LAB — COMPREHENSIVE METABOLIC PANEL
ALK PHOS: 83 IU/L (ref 39–117)
ALT: 14 IU/L (ref 0–44)
AST: 22 IU/L (ref 0–40)
Albumin/Globulin Ratio: 2.1 (ref 1.2–2.2)
Albumin: 4.7 g/dL (ref 3.5–5.5)
BUN/Creatinine Ratio: 16 (ref 9–20)
BUN: 15 mg/dL (ref 6–20)
Bilirubin Total: 0.5 mg/dL (ref 0.0–1.2)
CHLORIDE: 98 mmol/L (ref 96–106)
CO2: 23 mmol/L (ref 18–29)
CREATININE: 0.91 mg/dL (ref 0.76–1.27)
Calcium: 9.5 mg/dL (ref 8.7–10.2)
GFR calc Af Amer: 128 mL/min/{1.73_m2} (ref >59)
GFR, EST NON AFRICAN AMERICAN: 110 mL/min/{1.73_m2} (ref >59)
Globulin, Total: 2.2 g/dL (ref 1.5–4.5)
Glucose: 89 mg/dL (ref 65–99)
POTASSIUM: 4.5 mmol/L (ref 3.5–5.2)
SODIUM: 140 mmol/L (ref 134–144)
Total Protein: 6.9 g/dL (ref 6.0–8.5)

## 2016-09-13 LAB — LIPID PANEL
CHOLESTEROL TOTAL: 239 mg/dL — AB (ref 100–199)
Chol/HDL Ratio: 4.9 ratio (ref 0.0–5.0)
HDL: 49 mg/dL (ref >39)
LDL Calculated: 174 mg/dL — ABNORMAL HIGH (ref 0–99)
Triglycerides: 81 mg/dL (ref 0–149)
VLDL CHOLESTEROL CAL: 16 mg/dL (ref 5–40)

## 2017-06-27 ENCOUNTER — Other Ambulatory Visit: Payer: Self-pay | Admitting: Family Medicine

## 2019-08-31 ENCOUNTER — Ambulatory Visit (INDEPENDENT_AMBULATORY_CARE_PROVIDER_SITE_OTHER): Payer: BC Managed Care – PPO

## 2019-08-31 ENCOUNTER — Encounter: Payer: Self-pay | Admitting: Family Medicine

## 2019-08-31 ENCOUNTER — Other Ambulatory Visit: Payer: Self-pay

## 2019-08-31 ENCOUNTER — Ambulatory Visit: Payer: BC Managed Care – PPO | Admitting: Family Medicine

## 2019-08-31 VITALS — BP 106/78 | HR 76 | Ht 72.0 in | Wt 186.0 lb

## 2019-08-31 DIAGNOSIS — M25571 Pain in right ankle and joints of right foot: Secondary | ICD-10-CM

## 2019-08-31 DIAGNOSIS — S46211A Strain of muscle, fascia and tendon of other parts of biceps, right arm, initial encounter: Secondary | ICD-10-CM

## 2019-08-31 DIAGNOSIS — M76821 Posterior tibial tendinitis, right leg: Secondary | ICD-10-CM

## 2019-08-31 DIAGNOSIS — S46119A Strain of muscle, fascia and tendon of long head of biceps, unspecified arm, initial encounter: Secondary | ICD-10-CM | POA: Insufficient documentation

## 2019-08-31 DIAGNOSIS — M76829 Posterior tibial tendinitis, unspecified leg: Secondary | ICD-10-CM | POA: Insufficient documentation

## 2019-08-31 MED ORDER — NITROGLYCERIN 0.2 MG/HR TD PT24: MEDICATED_PATCH | TRANSDERMAL | 0 refills | Status: DC

## 2019-08-31 NOTE — Assessment & Plan Note (Signed)
New problem.  Likely secondary to more of her shoes.  Discussed icing regimen and home exercise, which activities to do which wants to avoid.  Patient is to increase activity slowly over the course the next several weeks.  Discussed icing regimen.  Patient will follow-up again in 6 weeks

## 2019-08-31 NOTE — Assessment & Plan Note (Signed)
Partial tear noted with some scar tissue formation on ultrasound.  Discussed with patient icing regimen, home exercise, compression, nitroglycerin patch to aid in healing.  I believe patient should do relatively well overall.  Follow-up again in 4 to 6 weeks

## 2019-08-31 NOTE — Patient Instructions (Addendum)
New shoes Heel lift 1/8 inch Shoulder- arm compression sleeve Vit D 2000 IU daily Nitroglycerin Protocol   Apply 1/4 nitroglycerin patch to affected area daily.  Change position of patch within the affected area every 24 hours.  You may experience a headache during the first 1-2 weeks of using the patch, these should subside.  If you experience headaches after beginning nitroglycerin patch treatment, you may take your preferred over the counter pain reliever.  Another side effect of the nitroglycerin patch is skin irritation or rash related to patch adhesive.  Please notify our office if you develop more severe headaches or rash, and stop the patch.  Tendon healing with nitroglycerin patch may require 12 to 24 weeks depending on the extent of injury.  Men should not use if taking Viagra, Cialis, or Levitra.   Do not use if you have migraines or rosacea. See me in 6 weeks
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
## 2019-09-01 ENCOUNTER — Telehealth: Payer: Self-pay | Admitting: *Deleted

## 2019-09-01 NOTE — Telephone Encounter (Signed)
Pt called stating that he saw Dr. Tamala Julian yesterday and was prescribed 1/2 patch of nitroglycerin. Pt would like to know if it's okay that he drinks beer while using the patches. Pt also stated that Dr. Tamala Julian mentioned giving him samples of Pennsaid but he never received those.

## 2019-09-01 NOTE — Telephone Encounter (Signed)
Spoke with patient. Per verbal from Dr. Tamala Julian, patient is ok to consume alcohol while on nitro patches. Samples of Pennsaid will be at front desk for patient.

## 2019-09-22 ENCOUNTER — Other Ambulatory Visit: Payer: Self-pay | Admitting: Family Medicine

## 2019-10-06 ENCOUNTER — Ambulatory Visit: Payer: BLUE CROSS/BLUE SHIELD | Admitting: Family Medicine

## 2019-10-13 ENCOUNTER — Encounter: Payer: Self-pay | Admitting: Family Medicine

## 2019-10-13 ENCOUNTER — Ambulatory Visit: Payer: BC Managed Care – PPO | Admitting: Family Medicine

## 2019-10-13 ENCOUNTER — Ambulatory Visit: Payer: Self-pay

## 2019-10-13 ENCOUNTER — Other Ambulatory Visit: Payer: Self-pay

## 2019-10-13 VITALS — BP 108/76 | HR 88 | Ht 72.0 in | Wt 187.0 lb

## 2019-10-13 DIAGNOSIS — M25512 Pain in left shoulder: Secondary | ICD-10-CM

## 2019-10-13 DIAGNOSIS — M76821 Posterior tibial tendinitis, right leg: Secondary | ICD-10-CM | POA: Diagnosis not present

## 2019-10-13 DIAGNOSIS — M25511 Pain in right shoulder: Secondary | ICD-10-CM

## 2019-10-13 DIAGNOSIS — S46211A Strain of muscle, fascia and tendon of other parts of biceps, right arm, initial encounter: Secondary | ICD-10-CM | POA: Diagnosis not present

## 2019-10-13 DIAGNOSIS — G8929 Other chronic pain: Secondary | ICD-10-CM

## 2019-10-13 MED ORDER — MELOXICAM 15 MG PO TABS: 15.0000 mg | ORAL_TABLET | Freq: Every day | ORAL | 0 refills | Status: DC

## 2019-10-13 NOTE — Assessment & Plan Note (Signed)
Improvement noted.  Increase activity slowly.  Discussed icing regimen and home exercises, patient should do relatively well overall.  Follow-up again in 6 to 8 weeks.

## 2019-10-13 NOTE — Progress Notes (Signed)
[place] [address] [place] Phone: [phone] Subjective:   I David Wallace am serving as a Education administrator for Dr. Hulan Saas.  This visit occurred during the SARS-CoV-2 public health emergency.  Safety protocols were in place, including screening questions prior to the visit, additional usage of staff PPE, and extensive cleaning of exam room while observing appropriate contact time as indicated for disinfecting solutions.   I'm seeing this patient by the request  of:  Leandrew Koyanagi, MD  CC: Shoulder and ankle pain follow-up  [id number]   08/31/2019 Partial tear noted with some scar tissue formation on ultrasound.  Discussed with patient icing regimen, home exercise, compression, nitroglycerin patch to aid in healing.  I believe patient should do relatively well overall.  Follow-up again in 4 to 6 weeks  New problem.  Likely secondary to more of her shoes.  Discussed icing regimen and home exercise, which activities to do which wants to avoid.  Patient is to increase activity slowly over the course the next several weeks.  Discussed icing regimen.  Patient will follow-up again in 6 weeks  Update 10/13/2019 David Wallace is a 39 y.o. male coming in with complaint of right ankle and right bicep pain. Patient states he is doing well. Achilles is doing better. Monday he had pain. The next day he was fine. States it was painful with stairs and weight bearing. It was posterior medial ankle pain. Some lingering pain but overall doing better. Shoulder is doing better. Patient is still using nitro and states he can tell a difference. Still feeling some discomfort with palpation. Left shoulder is starting to hurt. Pain is identical to the right shoulder. Has been using nitro bilaterally.        Past Medical History:  Diagnosis Date  . HSV-1 (herpes simplex virus 1) infection   . Testicular cancer [facility])    age 23   Past Surgical  History:  Procedure Laterality Date  . intestinal obstruction  2005  . ORCHIECTOMY    . WISDOM TOOTH EXTRACTION     Social History   Socioeconomic History  . Marital status: Married    Spouse name: Not on file  . Number of children: Not on file  . Years of education: Not on file  . Highest education level: Not on file  Occupational History  . Not on file  Tobacco Use  . Smoking status: Never Smoker  . Smokeless tobacco: Never Used  Substance and Sexual Activity  . Alcohol use: Yes    Alcohol/week: 1.0 - 2.0 standard drinks    Types: 1 - 2 Standard drinks or equivalent per week  . Drug use: No  . Sexual activity: Not on file  Other Topics Concern  . Not on file  Social History Narrative  . Not on file   Social Determinants of Health   Financial Resource Strain:   . Difficulty of Paying Living Expenses:   Food Insecurity:   . Worried About Charity fundraiser in the Last Year:   . Arboriculturist in the Last Year:   Transportation Needs:   . Film/video editor (Medical):   Marland Kitchen Lack of Transportation (Non-Medical):   Physical Activity:   . Days of Exercise per Week:   . Minutes of Exercise per Session:   Stress:   . Feeling of Stress :   Social Connections:   . Frequency of Communication with Friends and Family:   .  Frequency of Social Gatherings with Friends and Family:   . Attends Religious Services:   . Active Member of Clubs or Organizations:   . Attends Archivist Meetings:   Marland Kitchen Marital Status:    No Known Allergies Family History  Problem Relation Age of Onset  . Heart attack Father   . Heart disease Paternal Uncle   . Cancer Maternal Grandmother   . Arrhythmia Maternal Grandmother   . Cancer Maternal Grandfather   . Dementia Paternal Grandmother   . Heart disease Paternal Grandmother   . Cancer Paternal Grandfather   . Heart disease Paternal Uncle      Current Outpatient Medications (Cardiovascular):  .  nitroGLYCERIN (NITRODUR -  DOSED IN MG/24 HR) 0.2 mg/hr patch, APPLY 1/4 OF A PATCH TO SKIN ONCE DAILY.   Current Outpatient Medications (Analgesics):  .  meloxicam (MOBIC) 15 MG tablet, Take 1 tablet (15 mg total) by mouth daily.   Current Outpatient Medications (Other):  .  valACYclovir (VALTREX) 1000 MG tablet, Take 1 tablet (1,000 mg total) by mouth 2 (two) times daily.   Reviewed prior external information including notes and imaging from  primary care provider As well as notes that were available from care everywhere and other healthcare systems.  Past medical history, social, surgical and family history all reviewed in electronic medical record.  No pertanent information unless stated regarding to the chief complaint.   Review of Systems:  No headache, visual changes, nausea, vomiting, diarrhea, constipation, dizziness, abdominal pain, skin rash, fevers, chills, night sweats, weight loss, swollen lymph nodes, body aches, joint swelling, chest pain, shortness of breath, mood changes. POSITIVE muscle aches  Objective  Blood pressure 108/76, pulse 88, height 6' (1.829 m), weight 187 lb (84.8 kg), SpO2 98 %.   General: No apparent distress alert and oriented x3 mood and affect normal, dressed appropriately.  HEENT: Pupils equal, extraocular movements intact  Respiratory: Patient's speak in full sentences and does not appear short of breath  Cardiovascular: No lower extremity edema, non tender, no erythema  Neuro: Cranial nerves II through XII are intact, neurovascularly intact in all extremities with 2+ DTRs and 2+ pulses.  Gait normal with good balance and coordination.  MSK: Right ankle show the patient does have full range of motion.  Very minimal tenderness over the posterior tibialis tendon.  No swelling noted.  Negative Tinel's sign.  Right shoulder still shows some mild positive impingement with Hawkins.  Patient does have some very mild instability of the shoulders bilaterally.  Patient otherwise 5-5  strength of the rotator cuff bilaterally.  Limited musculoskeletal ultrasound was performed and interpreted by [name]  Limited ultrasound of patient's right ankle still shows the patient actually has more of a retroactive bursitis of the posterior tibialis tendon than truly any scarring.  Patient does not have any dilation of the tendon.  Seems to be doing well.  Limited ultrasound of the bicep tendon shows the patient does still have some mild scar tissue formation where the bicep tendon goes intra-articular into the glenohumeral area.  Improvement though of the hypoechoic changes. Impression: Interval healing of the posterior tibialis as well as the bicep tendinitis    Impression and Recommendations:     This case required medical decision making of moderate complexity. The above documentation has been reviewed and is accurate and complete [name], DO       Note: This dictation was prepared with Dragon dictation along with smaller phrase technology. Any  transcriptional errors that result from this process are unintentional.

## 2019-10-13 NOTE — Assessment & Plan Note (Signed)
Significant improvement with very mild hypoechoic changes still remaining.  I believe the patient should do well with conservative therapy.  Discussed icing regimen and home exercises, discussed proper shoes and potentially a heel lift.  Patient will try to make these changes and see me again in 6 to 8 weeks.

## 2019-10-13 NOTE — Patient Instructions (Addendum)
Good to see you Looks good overall 15 mg meloxicam Daily for 2 weeks then as needed for inflammation Can do the nitro on both sides watch for headaches and do not wear at night See me again in 8 weeks

## 2019-11-09 ENCOUNTER — Other Ambulatory Visit: Payer: Self-pay | Admitting: Family Medicine

## 2019-12-15 ENCOUNTER — Ambulatory Visit: Payer: BC Managed Care – PPO | Admitting: Family Medicine

## 2020-03-18 ENCOUNTER — Other Ambulatory Visit: Payer: Self-pay

## 2020-03-18 ENCOUNTER — Encounter (HOSPITAL_COMMUNITY): Payer: Self-pay

## 2020-03-18 ENCOUNTER — Ambulatory Visit (HOSPITAL_COMMUNITY): Admission: EM | Admit: 2020-03-18 | Discharge: 2020-03-18 | Payer: BC Managed Care – PPO

## 2020-03-18 DIAGNOSIS — M79604 Pain in right leg: Secondary | ICD-10-CM | POA: Diagnosis not present

## 2020-03-18 NOTE — Discharge Instructions (Addendum)
Go to the Emergency Department if you are concerned for a blood clot in your leg.

## 2020-03-18 NOTE — ED Triage Notes (Signed)
Pt reports being Covid positive, on Day 9.  C/O starting yesterday with small area of tenderness to right medial knee, worse with light palpation; pt states has concern for possible blood clot.  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  Right medial knee unremarkable - no swelling, no redness, no warmth.  States pain is worse with standing and straightening right knee. Discussed with K. Hall Busing, NP.  Informed pt that to completely rule out blood clot, he needs to be seen in ED, as we won't be able to give hime 100% assurance without diagnostic study not available here.  Pt verbalized understanding, but states he wishes to have exam at [facility] anyway before possibly going to ED.

## 2020-03-18 NOTE — ED Provider Notes (Signed)
[place]    CSN: [id number] Arrival date & time: 03/18/20  1037      History   Chief Complaint Chief Complaint  Patient presents with  . Leg Pain    HPI David Wallace is a 39 y.o. male.   Presents with pain in his right lateral lower thigh x1 day.  No falls or injury.  Patient is concerned about a possible blood clot because he was diagnosed with COVID 9 days ago.  He has a history of a blood clot several years ago.  The area on his leg is not red, swollen, hot.  He denies fever or chills.  Medical history significant for testicular cancer and HSV.  The history is provided by the patient.    Past Medical History:  Diagnosis Date  . HSV-1 (herpes simplex virus 1) infection   . Testicular cancer [facility])    age 58    Patient Active Problem List   Diagnosis Date Noted  . Posterior tibial tendinitis 08/31/2019  . Traumatic partial tear of biceps tendon 08/31/2019  . History of testicular cancer 11/09/2012    Past Surgical History:  Procedure Laterality Date  . intestinal obstruction  2005  . ORCHIECTOMY    . WISDOM TOOTH EXTRACTION         Home Medications    Prior to Admission medications   Medication Sig Start Date End Date Taking? Authorizing Provider  meloxicam (MOBIC) 15 MG tablet TAKE 1 TABLET BY MOUTH EVERY DAY 11/09/19   [name], DO  nitroGLYCERIN (NITRODUR - DOSED IN MG/24 HR) 0.2 mg/hr patch APPLY 1/4 OF A PATCH TO SKIN ONCE DAILY. 09/22/19   [name], DO  valACYclovir (VALTREX) 1000 MG tablet Take 1 tablet (1,000 mg total) by mouth 2 (two) times daily. 08/03/15   Jaynee Eagles, PA-C    Family History Family History  Problem Relation Age of Onset  . Heart attack Father   . Heart disease Paternal Uncle   . Cancer Maternal Grandmother   . Arrhythmia Maternal Grandmother   . Cancer Maternal Grandfather   . Dementia Paternal Grandmother   . Heart disease Paternal Grandmother   . Cancer Paternal Grandfather   . Heart  disease Paternal Uncle     Social History Social History   Tobacco Use  . Smoking status: Never Smoker  . Smokeless tobacco: Never Used  Substance Use Topics  . Alcohol use: Yes    Alcohol/week: 1.0 - 2.0 standard drink    Types: 1 - 2 Standard drinks or equivalent per week  . Drug use: No     Allergies   Patient has no known allergies.   Review of Systems Review of Systems  Constitutional: Negative for chills and fever.  HENT: Negative for ear pain and sore throat.   Eyes: Negative for pain and visual disturbance.  Respiratory: Negative for cough and shortness of breath.   Cardiovascular: Negative for chest pain and palpitations.  Gastrointestinal: Negative for abdominal pain and vomiting.  Genitourinary: Negative for dysuria and hematuria.  Musculoskeletal: Negative for arthralgias, back pain and gait problem.       Thigh pain.  Skin: Negative for color change, rash and wound.  Neurological: Negative for seizures, syncope, weakness and numbness.  All other systems reviewed and are negative.    Physical Exam Triage Vital Signs ED Triage Vitals  Enc Vitals Group     BP 03/18/20 1104 117/81     Pulse Rate 03/18/20 1104 73  Resp 03/18/20 1104 18     Temp 03/18/20 1104 98.7 F (37.1 C)     Temp Source 03/18/20 1104 Oral     SpO2 03/18/20 1104 100 %     Weight --      Height --      Head Circumference --      Peak Flow --      Pain Score 03/18/20 1106 0     Pain Loc --      Pain Edu? --      Excl. in [place]? --    No data found.  Updated Vital Signs BP 117/81 (BP Location: Right Arm)   Pulse 73   Temp 98.7 F (37.1 C) (Oral)   Resp 18   SpO2 100%   Visual Acuity Right Eye Distance:   Left Eye Distance:   Bilateral Distance:    Right Eye Near:   Left Eye Near:    Bilateral Near:     Physical Exam Vitals and nursing note reviewed.  Constitutional:      General: He is not in acute distress.    Appearance: He is well-developed. He is not  ill-appearing.  HENT:     Head: Normocephalic and atraumatic.     Mouth/Throat:     Mouth: Mucous membranes are moist.  Eyes:     Conjunctiva/sclera: Conjunctivae normal.  Cardiovascular:     Rate and Rhythm: Normal rate and regular rhythm.     Heart sounds: No murmur heard.   Pulmonary:     Effort: Pulmonary effort is normal. No respiratory distress.     Breath sounds: Normal breath sounds.  Abdominal:     Palpations: Abdomen is soft.     Tenderness: There is no abdominal tenderness.  Musculoskeletal:        General: No swelling, tenderness, deformity or signs of injury. Normal range of motion.     Cervical back: Neck supple.     Comments: Right thigh nontender to palpation; no erythema, edema, warmth, lesions, rash.   Skin:    General: Skin is warm and dry.     Capillary Refill: Capillary refill takes less than 2 seconds.     Findings: No bruising, erythema, lesion or rash.  Neurological:     General: No focal deficit present.     Mental Status: He is alert and oriented to person, place, and time.     Sensory: No sensory deficit.     Motor: No weakness.     Gait: Gait normal.  Psychiatric:        Mood and Affect: Mood normal.        Behavior: Behavior normal.      UC Treatments / Results  Labs (all labs ordered are listed, but only abnormal results are displayed) Labs Reviewed - No data to display  EKG   Radiology No results found.  Procedures Procedures (including critical care time)  Medications Ordered in UC Medications - No data to display  Initial Impression / Assessment and Plan / UC Course  I have reviewed the triage vital signs and the nursing notes.  Pertinent labs & imaging results that were available during my care of the patient were reviewed by me and considered in my medical decision making (see chart for details).   Right leg pain.  Discussed with patient at length that he needs to go to the ED if he suspects a blood clot in his leg.   Discussed that his leg does not appear  to have typical signs of a blood clot but that he would need an ultrasound to rule this out.  Patient agrees to plan of care.   Final Clinical Impressions(s) / UC Diagnoses   Final diagnoses:  Right leg pain     Discharge Instructions     Go to the Emergency Department if you are concerned for a blood clot in your leg.       ED Prescriptions    None     PDMP not reviewed this encounter.   Sharion Balloon, NP 03/18/20 1204

## 2020-03-18 NOTE — ED Notes (Signed)
Patient is being discharged from the Urgent Care and sent to the Emergency Department via personal vehicle . Per provider Barkley Boards, patient is in need of higher level of care due to concerns of blood clot. Patient is aware and verbalizes understanding of plan of care.   Vitals:   03/18/20 1104  BP: 117/81  Pulse: 73  Resp: 18  Temp: 98.7 F (37.1 C)  SpO2: 100%

## 2020-03-18 NOTE — ED Triage Notes (Signed)
Pt present right leg pain.symptom started yesterday. Pt states that the area is not painful but discomfort and hurts to stand on it.

## 2021-03-22 ENCOUNTER — Ambulatory Visit: Payer: BC Managed Care – PPO | Admitting: Sports Medicine

## 2021-03-22 ENCOUNTER — Other Ambulatory Visit: Payer: Self-pay

## 2021-03-22 ENCOUNTER — Ambulatory Visit: Payer: Self-pay

## 2021-03-22 VITALS — BP 100/60 | HR 78 | Ht 72.0 in | Wt 180.0 lb

## 2021-03-22 DIAGNOSIS — M25561 Pain in right knee: Secondary | ICD-10-CM

## 2021-03-22 DIAGNOSIS — M7651 Patellar tendinitis, right knee: Secondary | ICD-10-CM | POA: Diagnosis not present

## 2021-03-22 MED ORDER — MELOXICAM 15 MG PO TABS: 15.0000 mg | ORAL_TABLET | Freq: Every day | ORAL | 0 refills | Status: DC

## 2021-03-22 NOTE — Progress Notes (Signed)
David Wallace D.[facility] [place] Phone: [phone]   Assessment and Plan:     1. Acute pain of right knee 2.  Patella tendinitis, right knee, initial encounter - Subacute, initial visit - Likely patellar tendinopathy at inferior patellar pole based on HPI, physical exam, ultrasound - Activity modification including reducing squatting for the next 2 weeks with gradual reintroduction - Start meloxicam 15 mg daily x2 weeks and may use remaining as needed for pain control - Start HEP for patellar tendinitis - Advised to obtain a patellar knee strap OTC to use during athletic activity  Sports Medicine: Musculoskeletal Ultrasound. Exam: Limited US of right knee Diagnosis: Right knee pain  US Findings: Mildly dysregulated fibers with hypoechoic changes consistent with edema at base of patella and origin septilin tendon.  No disruption of patellar tendon.  US Impression:  Patellar tendinopathy  Pertinent previous records reviewed include none   Follow Up: In 4 weeks if no improvement or worsening of symptoms.  Could consider repeat ultrasound at that time   Subjective:   I, David Wallace, am serving as a scribe for Dr. Glennon Wallace  Chief Complaint: right knee pain   HPI:   03/22/21 Patient is a 40 year old male presenting with right knee pain. Last seen by Dr. Tamala Julian in 2021 for shoulder and ankle pain. Patient states plays a lot of tennis and started up this year but two months ago started getting right knee discomfort and worse when going up stair. Patient locates pain to right below patella. Describes pain as a constant discomfort at a 1-2/10. 2 weeks ago patient was doing weighted squats and the pain was flared for several days.   Mechanical symptoms: no Swelling: no Treatments tried: advil    Relevant Historical Information: None pertinent  Additional pertinent review of systems  negative.   Current Outpatient Medications:    meloxicam (MOBIC) 15 MG tablet, Take 1 tablet (15 mg total) by mouth daily., Disp: 30 tablet, Rfl: 0   meloxicam (MOBIC) 15 MG tablet, TAKE 1 TABLET BY MOUTH EVERY DAY (Patient not taking: Reported on 03/22/2021), Disp: 30 tablet, Rfl: 0   nitroGLYCERIN (NITRODUR - DOSED IN MG/24 HR) 0.2 mg/hr patch, APPLY 1/4 OF A PATCH TO SKIN ONCE DAILY. (Patient not taking: Reported on 03/22/2021), Disp: 30 patch, Rfl: 0   valACYclovir (VALTREX) 1000 MG tablet, Take 1 tablet (1,000 mg total) by mouth 2 (two) times daily. (Patient not taking: Reported on 03/22/2021), Disp: 15 tablet, Rfl: 0   Objective:     Vitals:   03/22/21 1524  BP: 100/60  Pulse: 78  SpO2: 98%  Weight: 180 lb (81.6 kg)  Height: 6' (1.829 m)      Body mass index is 24.41 kg/m.    Physical Exam:    General:  awake, alert oriented, no acute distress nontoxic Skin: no suspicious lesions or rashes Neuro:sensation intact, no deficits, strength 5/5 with no deficits, no atrophy, normal muscle tone Psych: No signs of anxiety, depression or other mood disorder  Knee: No swelling No deformity Neg fluid wave, joint milking ROM Flex 110, Ext 0 TTP mildly at inferior pole of patella at patellar tendon origin site NTTP over the quad tendon, medial fem condyle, lat fem condyle, plica, patella tendon, tibial tuberostiy, fibular head, posterior fossa, pes anserine bursa, gerdy's tubercle, medial jt line, lateral jt line Neg anterior and posterior drawer Neg lachman Neg sag sign Negative varus stress  Negative valgus stress Negative McMurray Negative Thessaly  Gait normal    Electronically signed by:  David Wallace D.[facility] 3:58 PM 03/22/21

## 2021-03-22 NOTE — Patient Instructions (Signed)
Good to see you  Get a patellar strap Meloxicam 15mg  daily for 2 weeks  Exercises given  See [place] back in 4 weeks if no improvement

## 2021-04-23 ENCOUNTER — Other Ambulatory Visit: Payer: Self-pay | Admitting: Sports Medicine

## 2022-02-06 NOTE — Progress Notes (Signed)
[facility] [facility] [place] Phone: [phone] Subjective:   Fontaine No, am serving as a scribe for Dr. Hulan Saas.  I'm seeing this patient by the request  of:  [name], MD  CC: Bilateral knee pain  HER:[id number]  Last seen 2021 for shoulder pain. Saw Glennon Mac October 2022 for knee pain.  Updated 02/07/2022 [place] is a 41 y.o. male coming in with complaint of bilateral knee pain. Patient states that he has not played tennis over the winter but pain persisted in patellar tendons. Patient continues to play a lot of tennis. Painful after playing and with stairs. Meloxicam was helpful.        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[place])    age 77   Past Surgical History:  Procedure Laterality Date   intestinal obstruction  2005   ORCHIECTOMY     WISDOM TOOTH EXTRACTION     Social History   Socioeconomic History   Marital status: Married    Spouse name: Not on file   Number of children: Not on file   Years of education: Not on file   Highest education level: Not on file  Occupational History   Not on file  Tobacco Use   Smoking status: Never   Smokeless tobacco: Never  Substance and Sexual Activity   Alcohol use: Yes    Alcohol/week: 1.0 - 2.0 standard drink of alcohol    Types: 1 - 2 Standard drinks or equivalent per week   Drug use: No   Sexual activity: Not on file  Other Topics Concern   Not on file  Social History Narrative   Not on file   Social Determinants of Health   Financial Resource Strain: Not on file  Food Insecurity: Not on file  Transportation Needs: Not on file  Physical Activity: Not on file  Stress: Not on file  Social Connections: Not on file   No Known Allergies Family History  Problem Relation Age of Onset   Heart attack Father    Heart disease Paternal Uncle    Cancer Maternal  Grandmother    Arrhythmia Maternal Grandmother    Cancer Maternal Grandfather    Dementia Paternal Grandmother    Heart disease Paternal Grandmother    Cancer Paternal Grandfather    Heart disease Paternal Uncle      Current Outpatient Medications (Cardiovascular):    nitroGLYCERIN (NITRO-DUR) 0.2 mg/hr patch, Apply 1/4 of a patch to skin once daily.   nitroGLYCERIN (NITRODUR - DOSED IN MG/24 HR) 0.2 mg/hr patch, APPLY 1/4 OF A PATCH TO SKIN ONCE DAILY.   Current Outpatient Medications (Analgesics):    meloxicam (MOBIC) 15 MG tablet, TAKE 1 TABLET BY MOUTH EVERY DAY   meloxicam (MOBIC) 15 MG tablet, Take 1 tablet (15 mg total) by mouth daily.   meloxicam (MOBIC) 15 MG tablet, Take 1 tablet (15 mg total) by mouth daily.   Current Outpatient Medications (Other):    valACYclovir (VALTREX) 1000 MG tablet, Take 1 tablet (1,000 mg total) by mouth 2 (two) times daily.   Vitamin D, Ergocalciferol, (DRISDOL) 1.25 MG (50000 UNIT) CAPS capsule, Take 1 capsule (50,000 Units total) by mouth every 7 (seven) days.   Reviewed prior external information including notes and imaging from  primary care provider As well as notes that were available from care everywhere and other healthcare systems.  Past medical history, social, surgical and family history all reviewed in electronic medical record.  No pertanent information unless stated regarding to the chief complaint.   Review of Systems:  No headache, visual changes, nausea, vomiting, diarrhea, constipation, dizziness, abdominal pain, skin rash, fevers, chills, night sweats, weight loss, swollen lymph nodes, body aches, joint swelling, chest pain, shortness of breath, mood changes.  Objective  Blood pressure 108/64, pulse 78, height 6' (1.829 m), weight 190 lb (86.2 kg), SpO2 99 %.   General: No apparent distress alert and oriented x3 mood and affect normal, dressed appropriately.  HEENT: Pupils equal, extraocular movements intact   Respiratory: Patient's speak in full sentences and does not appear short of breath  Cardiovascular: No lower extremity edema, non tender, no erythema  Bilateral knee exam shows the patient does have some tenderness to palpation at the proximal aspect of the patella.  Full strength noted bilaterally.  No crepitus of the knee.  Neurovascularly intact distally.  Limited muscular skeletal ultrasound was performed and interpreted by Hulan Saas, M  Limited ultrasound of patient's patella tendon bilaterally shows significant number of calcific deposits noted especially of the deep fibers.  Patient may have some intersubstance tearing but no increasing Doppler flow or neovascularization noted. Impression: Calcific tendinitis   Impression and Recommendations:     The above documentation has been reviewed and is accurate and complete [name], DO

## 2022-02-07 ENCOUNTER — Ambulatory Visit: Payer: Self-pay

## 2022-02-07 ENCOUNTER — Ambulatory Visit: Payer: BC Managed Care – PPO | Admitting: Family Medicine

## 2022-02-07 ENCOUNTER — Encounter: Payer: Self-pay | Admitting: Family Medicine

## 2022-02-07 VITALS — BP 108/64 | HR 78 | Ht 72.0 in | Wt 190.0 lb

## 2022-02-07 DIAGNOSIS — M65261 Calcific tendinitis, right lower leg: Secondary | ICD-10-CM | POA: Diagnosis not present

## 2022-02-07 DIAGNOSIS — M25562 Pain in left knee: Secondary | ICD-10-CM

## 2022-02-07 DIAGNOSIS — G8929 Other chronic pain: Secondary | ICD-10-CM | POA: Diagnosis not present

## 2022-02-07 DIAGNOSIS — M25561 Pain in right knee: Secondary | ICD-10-CM | POA: Diagnosis not present

## 2022-02-07 DIAGNOSIS — M65262 Calcific tendinitis, left lower leg: Secondary | ICD-10-CM | POA: Diagnosis not present

## 2022-02-07 MED ORDER — VITAMIN D (ERGOCALCIFEROL) 1.25 MG (50000 UNIT) PO CAPS: 50000.0000 [IU] | ORAL_CAPSULE | ORAL | 0 refills | Status: AC

## 2022-02-07 MED ORDER — MELOXICAM 15 MG PO TABS: 15.0000 mg | ORAL_TABLET | Freq: Every day | ORAL | 0 refills | Status: DC

## 2022-02-07 MED ORDER — NITROGLYCERIN 0.2 MG/HR TD PT24: MEDICATED_PATCH | TRANSDERMAL | 0 refills | Status: DC

## 2022-02-07 NOTE — Patient Instructions (Signed)
Meloxicam '15mg'$  for 10 days then as needed Nitroglycerin Protocol   Apply 1/4 nitroglycerin patch to affected area daily.  Change position of patch within the affected area every 24 hours.  You may experience a headache during the first 1-2 weeks of using the patch, these should subside.  If you experience headaches after beginning nitroglycerin patch treatment, you may take your preferred over the counter pain reliever.  Another side effect of the nitroglycerin patch is skin irritation or rash related to patch adhesive.  Please notify our office if you develop more severe headaches or rash, and stop the patch.  Tendon healing with nitroglycerin patch may require 12 to 24 weeks depending on the extent of injury.  Men should not use if taking Viagra, Cialis, or Levitra.   Do not use if you have migraines or rosacea.  Once weekly Vit D Exercises in 10 days See me again in 5-6 weeks

## 2022-02-07 NOTE — Assessment & Plan Note (Signed)
if it continues noted.  Starting once weekly vitamin D, lower actions.  Patient warned of potential side effects.  We discussed eccentric exercises to start in 10 days.  Discussed icing regimen.  Follow-up again in 6 to 8 weeks otherwise.  If continuing to have difficulty may need to consider laboratory work-up, formal physical therapy.

## 2022-03-13 NOTE — Progress Notes (Unsigned)
[place] Phone: [phone] Subjective:   David Wallace, am serving as a scribe for Dr. Hulan Saas.  I'm seeing this patient by the request  of:  [name], MD  CC: right ankle pain   [id number]  David Wallace is a 41 y.o. male coming in with complaint of R ankle pain. Patient states that he was playing tennis 3 weeks ago and rolled ankle. Has been icing and wearing brace. Pain over ATF worse when going up and down stairs. Pain radiates into achilles.   Knee pain has not changed since last visit. Worse with stairs. Using nitro patches and Vit D. Has not tried exercises.      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[place])    age 68   Past Surgical History:  Procedure Laterality Date   intestinal obstruction  2005   ORCHIECTOMY     WISDOM TOOTH EXTRACTION     Social History   Socioeconomic History   Marital status: Married    Spouse name: Not on file   Number of children: Not on file   Years of education: Not on file   Highest education level: Not on file  Occupational History   Not on file  Tobacco Use   Smoking status: Never   Smokeless tobacco: Never  Substance and Sexual Activity   Alcohol use: Yes    Alcohol/week: 1.0 - 2.0 standard drink of alcohol    Types: 1 - 2 Standard drinks or equivalent per week   Drug use: Wallace   Sexual activity: Not on file  Other Topics Concern   Not on file  Social History Narrative   Not on file   Social Determinants of Health   Financial Resource Strain: Not on file  Food Insecurity: Not on file  Transportation Needs: Not on file  Physical Activity: Not on file  Stress: Not on file  Social Connections: Not on file   Wallace Known Allergies Family History  Problem Relation Age of Onset   Heart attack Father    Heart disease Paternal Uncle    Cancer Maternal Grandmother     Arrhythmia Maternal Grandmother    Cancer Maternal Grandfather    Dementia Paternal Grandmother    Heart disease Paternal Grandmother    Cancer Paternal Grandfather    Heart disease Paternal Uncle     Current Outpatient Medications (Endocrine & Metabolic):    predniSONE (DELTASONE) 20 MG tablet, Take 2 tablets (40 mg total) by mouth daily with breakfast.  Current Outpatient Medications (Cardiovascular):    nitroGLYCERIN (NITRO-DUR) 0.2 mg/hr patch, Apply 1/4 of a patch to skin once daily.   nitroGLYCERIN (NITRODUR - DOSED IN MG/24 HR) 0.2 mg/hr patch, APPLY 1/4 OF A PATCH TO SKIN ONCE DAILY.   Current Outpatient Medications (Analgesics):    meloxicam (MOBIC) 15 MG tablet, TAKE 1 TABLET BY MOUTH EVERY DAY   meloxicam (MOBIC) 15 MG tablet, Take 1 tablet (15 mg total) by mouth daily.   meloxicam (MOBIC) 15 MG tablet, Take 1 tablet (15 mg total) by mouth daily.   meloxicam (MOBIC) 15 MG tablet, Take 1 tablet (15 mg total) by mouth daily.   Current Outpatient Medications (Other):    valACYclovir (VALTREX) 1000 MG tablet, Take 1 tablet (1,000 mg total) by mouth 2 (two) times daily.   Vitamin D, Ergocalciferol, (DRISDOL) 1.25 MG (  50000 UNIT) CAPS capsule, Take 1 capsule (50,000 Units total) by mouth every 7 (seven) days.   Reviewed prior external information including notes and imaging from  primary care provider As well as notes that were available from care everywhere and other healthcare systems.  Past medical history, social, surgical and family history all reviewed in electronic medical record.  Wallace pertanent information unless stated regarding to the chief complaint.   Review of Systems:  Wallace headache, visual changes, nausea, vomiting, diarrhea, constipation, dizziness, abdominal pain, skin rash, fevers, chills, night sweats, weight loss, swollen lymph nodes, body aches, joint swelling, chest pain, shortness of breath, mood changes. POSITIVE muscle aches  Objective  Blood pressure  110/76, pulse 82, height 6' (1.829 m), SpO2 98 %.   General: Wallace apparent distress alert and oriented x3 mood and affect normal, dressed appropriately.  HEENT: Pupils equal, extraocular movements intact  Respiratory: Patient's speak in full sentences and does not appear short of breath  Cardiovascular: Wallace lower extremity edema, non tender, Wallace erythema  Mild antalgic gait noted.  Right ankle does have swelling compared to the contralateral side.  Lacks the last 10 degrees of dorsiflexion.  Patient is minimally tender over the ATFL and mildly over the anterior aspect of the fibula medially.   Limited muscular skeletal ultrasound was performed and interpreted by Hulan Saas, M  Limited ultrasound shows patient does have hypoechoic changes noted in the ankle joint anteriorly.  Patient has significant effusion also noted though in the syndesmosis between the tibia and the fibula.  Wallace cortical irregularity though noted at the moment. Impression: 1 send most this injury with effusion of the ankle joint   Impression and Recommendations:    The above documentation has been reviewed and is accurate and complete [name], DO

## 2022-03-14 ENCOUNTER — Encounter: Payer: Self-pay | Admitting: Family Medicine

## 2022-03-14 ENCOUNTER — Ambulatory Visit: Payer: BC Managed Care – PPO | Admitting: Family Medicine

## 2022-03-14 ENCOUNTER — Ambulatory Visit: Payer: Self-pay

## 2022-03-14 ENCOUNTER — Ambulatory Visit (INDEPENDENT_AMBULATORY_CARE_PROVIDER_SITE_OTHER): Payer: BC Managed Care – PPO

## 2022-03-14 VITALS — BP 110/76 | HR 82 | Ht 72.0 in

## 2022-03-14 DIAGNOSIS — M25471 Effusion, right ankle: Secondary | ICD-10-CM

## 2022-03-14 DIAGNOSIS — M25571 Pain in right ankle and joints of right foot: Secondary | ICD-10-CM | POA: Diagnosis not present

## 2022-03-14 DIAGNOSIS — M25561 Pain in right knee: Secondary | ICD-10-CM | POA: Diagnosis not present

## 2022-03-14 DIAGNOSIS — M25562 Pain in left knee: Secondary | ICD-10-CM | POA: Diagnosis not present

## 2022-03-14 MED ORDER — PREDNISONE 20 MG PO TABS: 40.0000 mg | ORAL_TABLET | Freq: Every day | ORAL | 0 refills | Status: DC

## 2022-03-14 MED ORDER — MELOXICAM 15 MG PO TABS: 15.0000 mg | ORAL_TABLET | Freq: Every day | ORAL | 0 refills | Status: DC

## 2022-03-14 NOTE — Assessment & Plan Note (Addendum)
Patient is 3 weeks out since the injury.  Does seem to have an injury to the ATFL and likely the high ankle sprain.  Unfortunately patient still has an effusion noted.  Discussed with patient's to wear a cam walker short course of prednisone and then transitioning back to the meloxicam.  Follow-up again in 2 to 3 weeks.  X-rays pending

## 2022-03-14 NOTE — Patient Instructions (Signed)
Prednisone '40mg'$  for 5 days '15mg'$  of Meloxicam  Cam walker  Do not drive in the boot See me in 2-3 weeks ok to double book

## 2022-03-21 ENCOUNTER — Ambulatory Visit: Payer: BC Managed Care – PPO | Admitting: Family Medicine

## 2022-03-27 NOTE — Progress Notes (Unsigned)
[place] [address] Phone: [phone] Subjective:   David Wallace, am serving as a scribe for Dr. Hulan Saas.  I'm seeing this patient by the request  of:  [name], MD  CC: right ankle pain   [id number]  03/14/2022 Patient is 3 weeks out since the injury.  Does seem to have an injury to the ATFL and likely the high ankle sprain.  Unfortunately patient still has an effusion noted.  Discussed with patient's to wear a cam walker short course of prednisone and then transitioning back to the meloxicam.  Follow-up again in 2 to 3 weeks.  X-rays pending  Update 03/27/2022 David Wallace is a 41 y.o. male coming in with complaint of R ankle pain.  Patient still has an effusion of the ankle noted.  Patient was to wear cam walker and take a short course of prednisone and transition to the meloxicam.  X-rays only showed the effusion but otherwise nothing else significant.  This was independently visualized by me today.  Patient states that the boot helped to alleviate his pain. Has not worn boot for 2 days. Back to regular shoes. Only notices pain with forced PF like when he puts on his shoes. Has not tried tennis. Hiked 2 miles yesterday without pain.   B knee pain is now more present now that ankle pain has subsided. Painful to go up and down stairs over patellar tendons.        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[place])    age 41   Past Surgical History:  Procedure Laterality Date   intestinal obstruction  2005   ORCHIECTOMY     WISDOM TOOTH EXTRACTION     Social History   Socioeconomic History   Marital status: Married    Spouse name: Not on file   Number of children: Not on file   Years of education: Not on file   Highest education level: Not on file  Occupational History   Not on file  Tobacco Use   Smoking status: Never    Smokeless tobacco: Never  Substance and Sexual Activity   Alcohol use: Yes    Alcohol/week: 1.0 - 2.0 standard drink of alcohol    Types: 1 - 2 Standard drinks or equivalent per week   Drug use: Wallace   Sexual activity: Not on file  Other Topics Concern   Not on file  Social History Narrative   Not on file   Social Determinants of Health   Financial Resource Strain: Not on file  Food Insecurity: Not on file  Transportation Needs: Not on file  Physical Activity: Not on file  Stress: Not on file  Social Connections: Not on file   Wallace Known Allergies Family History  Problem Relation Age of Onset   Heart attack Father    Heart disease Paternal Uncle    Cancer Maternal Grandmother    Arrhythmia Maternal Grandmother    Cancer Maternal Grandfather    Dementia Paternal Grandmother    Heart disease Paternal Grandmother    Cancer Paternal Grandfather    Heart disease Paternal Uncle     Current Outpatient Medications (Endocrine & Metabolic):    predniSONE (DELTASONE) 20 MG tablet, Take 2 tablets (40 mg total) by mouth daily with breakfast.  Current Outpatient Medications (Cardiovascular):    nitroGLYCERIN (NITRO-DUR) 0.2 mg/hr patch, Apply 1/4  of a patch to skin once daily.   nitroGLYCERIN (NITRODUR - DOSED IN MG/24 HR) 0.2 mg/hr patch, APPLY 1/4 OF A PATCH TO SKIN ONCE DAILY.   Current Outpatient Medications (Analgesics):    meloxicam (MOBIC) 15 MG tablet, TAKE 1 TABLET BY MOUTH EVERY DAY   meloxicam (MOBIC) 15 MG tablet, Take 1 tablet (15 mg total) by mouth daily.   meloxicam (MOBIC) 15 MG tablet, Take 1 tablet (15 mg total) by mouth daily.   meloxicam (MOBIC) 15 MG tablet, Take 1 tablet (15 mg total) by mouth daily.   Current Outpatient Medications (Other):    valACYclovir (VALTREX) 1000 MG tablet, Take 1 tablet (1,000 mg total) by mouth 2 (two) times daily.   Vitamin D, Ergocalciferol, (DRISDOL) 1.25 MG (50000 UNIT) CAPS capsule, Take 1 capsule (50,000 Units total) by mouth  every 7 (seven) days.   Vitamin D, Ergocalciferol, (DRISDOL) 1.25 MG (50000 UNIT) CAPS capsule, Take 1 capsule (50,000 Units total) by mouth every 7 (seven) days.   Reviewed prior external information including notes and imaging from  primary care provider As well as notes that were available from care everywhere and other healthcare systems.  Past medical history, social, surgical and family history all reviewed in electronic medical record.  Wallace pertanent information unless stated regarding to the chief complaint.   Review of Systems:  Wallace headache, visual changes, nausea, vomiting, diarrhea, constipation, dizziness, abdominal pain, skin rash, fevers, chills, night sweats, weight loss, swollen lymph nodes, body aches, joint swelling, chest pain, shortness of breath, mood changes. POSITIVE muscle aches  Objective  Blood pressure 102/74, pulse 79, height 6' (1.829 m), weight 193 lb (87.5 kg), SpO2 96 %.   General: Wallace apparent distress alert and oriented x3 mood and affect normal, dressed appropriately.  HEENT: Pupils equal, extraocular movements intact  Respiratory: Patient's speak in full sentences and does not appear short of breath  Cardiovascular: Wallace lower extremity edema, non tender, Wallace erythema  Right ankle exam shows that patient does have improvement in range of motion.  Full strength noted.  Patient does have nontender areas over the talar dome.  Still seems to lack the last 5 degrees of dorsiflexion compared to the contralateral side.  Regarding patient's knee exam bilaterally still tender to palpation at the inferior aspect of the patella on the patella tendon right greater than left.  Wallace significant effusion noted.  Limited muscular skeletal ultrasound was performed and interpreted by Hulan Saas, M  Limited ultrasound of patient's ankle still shows hypoechoic changes that has improved but still has what appears to be a joint effusion of the ankle.  Wallace cortical irregularity  noted of the talar dome.  Regarding patient's knee bilaterally does have some calcific deposits noted in the proximal aspect of the patella tendon.  Some very mild increase in Doppler flow noted. Impression: Improvement in both the ankle effusion and the calcific changes of the patella tendinitis    Impression and Recommendations:     The above documentation has been reviewed and is accurate and complete David Pulley, DO

## 2022-04-01 ENCOUNTER — Ambulatory Visit: Payer: Self-pay

## 2022-04-01 ENCOUNTER — Ambulatory Visit: Payer: BC Managed Care – PPO | Admitting: Family Medicine

## 2022-04-01 ENCOUNTER — Encounter: Payer: Self-pay | Admitting: Family Medicine

## 2022-04-01 VITALS — BP 102/74 | HR 79 | Ht 72.0 in | Wt 193.0 lb

## 2022-04-01 DIAGNOSIS — M65262 Calcific tendinitis, left lower leg: Secondary | ICD-10-CM

## 2022-04-01 DIAGNOSIS — M25471 Effusion, right ankle: Secondary | ICD-10-CM | POA: Diagnosis not present

## 2022-04-01 DIAGNOSIS — M65261 Calcific tendinitis, right lower leg: Secondary | ICD-10-CM

## 2022-04-01 DIAGNOSIS — M25571 Pain in right ankle and joints of right foot: Secondary | ICD-10-CM | POA: Diagnosis not present

## 2022-04-01 MED ORDER — VITAMIN D (ERGOCALCIFEROL) 1.25 MG (50000 UNIT) PO CAPS: 50000.0000 [IU] | ORAL_CAPSULE | ORAL | 0 refills | Status: DC

## 2022-04-01 NOTE — Patient Instructions (Signed)
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

## 2022-04-02 NOTE — Assessment & Plan Note (Signed)
Calcific tendinitis has shown improvement noted.  Still slow over the course of time here.  Discussed different treatment options including the possibility of formal physical therapy and shockwave therapy.  Patient would like to give it more time to see how he continues to respond.  Will increase activity slowly over the course of the next several months.  Follow-up with me again in 2 months for further evaluation

## 2022-04-02 NOTE — Assessment & Plan Note (Signed)
Significant improvement in the effusion but still does on ultrasound have some.  We discussed different treatment options including anti-inflammatories, possible injection with aspiration, but patient feels like he is doing well and would like to increase activity and see how he responds.  Patient knows to call us immediately if any worsening discomfort or pain or swelling.  Follow-up with me again in 2 months to further evaluate

## 2022-04-10 ENCOUNTER — Other Ambulatory Visit: Payer: Self-pay | Admitting: Family Medicine

## 2022-05-29 NOTE — Progress Notes (Deleted)
[name] [place] Phone: [phone] Subjective:    I'm seeing this patient by the request  of:  [name], MD  CC: ankle pain follow up   [id number]  04/01/2022 Significant improvement in the effusion but still does on ultrasound have some.  We discussed different treatment options including anti-inflammatories, possible injection with aspiration, but patient feels like he is doing well and would like to increase activity and see how he responds.  Patient knows to call us immediately if any worsening discomfort or pain or swelling.  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 Still slow over the course of time here. Discussed different treatment options including the possibility of formal physical therapy and shockwave therapy. Patient would like to give it more time to see how he continues to respond. Will increase activity slowly over the course of the next several months. Follow-up with me again in 2 months for further evaluation   Update 06/04/2022 David Wallace is a 42 y.o. male coming in with complaint of B knee and R ankle pain. 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[place])    age 86   Past Surgical History:  Procedure Laterality Date   intestinal obstruction  2005   ORCHIECTOMY     WISDOM TOOTH EXTRACTION     Social History   Socioeconomic History   Marital status: Married    Spouse name: Not on file   Number of children: Not on file   Years of education: Not on file   Highest education level: Not on file  Occupational History   Not on file  Tobacco Use   Smoking status: Never   Smokeless tobacco: Never  Substance and Sexual Activity   Alcohol use: Yes    Alcohol/week: 1.0 - 2.0 standard drink of alcohol    Types: 1 - 2 Standard drinks  or equivalent per week   Drug use: No   Sexual activity: Not on file  Other Topics Concern   Not on file  Social History Narrative   Not on file   Social Determinants of Health   Financial Resource Strain: Not on file  Food Insecurity: Not on file  Transportation Needs: Not on file  Physical Activity: Not on file  Stress: Not on file  Social Connections: Not on file   No Known Allergies Family History  Problem Relation Age of Onset   Heart attack Father    Heart disease Paternal Uncle    Cancer Maternal Grandmother    Arrhythmia Maternal Grandmother    Cancer Maternal Grandfather    Dementia Paternal Grandmother    Heart disease Paternal Grandmother    Cancer Paternal Grandfather    Heart disease Paternal Uncle     Current Outpatient Medications (Endocrine & Metabolic):    predniSONE (DELTASONE) 20 MG tablet, Take 2 tablets (40 mg total) by mouth daily with breakfast.  Current Outpatient Medications (Cardiovascular):    nitroGLYCERIN (NITRO-DUR) 0.2 mg/hr patch, Apply 1/4 of a patch to skin once daily.   nitroGLYCERIN (NITRODUR - DOSED IN MG/24 HR) 0.2 mg/hr patch, APPLY 1/4 OF A PATCH TO SKIN ONCE DAILY.   Current Outpatient Medications (Analgesics):    meloxicam (MOBIC) 15 MG tablet, TAKE 1 TABLET BY MOUTH DAILY.  Current Outpatient Medications (Other):    valACYclovir (VALTREX) 1000 MG tablet, Take 1 tablet (1,000 mg total) by mouth 2 (two) times daily.   Vitamin D, Ergocalciferol, (DRISDOL) 1.25 MG (50000 UNIT) CAPS capsule, Take 1 capsule (50,000 Units total) by mouth every 7 (seven) days.   Vitamin D, Ergocalciferol, (DRISDOL) 1.25 MG (50000 UNIT) CAPS capsule, Take 1 capsule (50,000 Units total) by mouth every 7 (seven) days.   Reviewed prior external information including notes and imaging from  primary care provider As well as notes that were available from care everywhere and other healthcare systems.  Past medical history, social, surgical and family  history all reviewed in electronic medical record.  No pertanent information unless stated regarding to the chief complaint.   Review of Systems:  No headache, visual changes, nausea, vomiting, diarrhea, constipation, dizziness, abdominal pain, skin rash, fevers, chills, night sweats, weight loss, swollen lymph nodes, body aches, joint swelling, chest pain, shortness of breath, mood changes. POSITIVE muscle aches  Objective  There were no vitals taken for this visit.   General: No apparent distress alert and oriented x3 mood and affect normal, dressed appropriately.  HEENT: Pupils equal, extraocular movements intact  Respiratory: Patient's speak in full sentences and does not appear short of breath  Cardiovascular: No lower extremity edema, non tender, no erythema  Ankle exam shows      Impression and Recommendations:    The above documentation has been reviewed and is accurate and complete David Pulley, DO

## 2022-06-04 ENCOUNTER — Ambulatory Visit: Payer: BC Managed Care – PPO | Admitting: Family Medicine

## 2022-06-05 ENCOUNTER — Ambulatory Visit: Payer: BC Managed Care – PPO | Admitting: Family Medicine

## 2022-06-05 ENCOUNTER — Encounter: Payer: Self-pay | Admitting: Family Medicine

## 2022-06-05 ENCOUNTER — Ambulatory Visit: Payer: Self-pay

## 2022-06-05 VITALS — BP 110/82 | HR 66 | Ht 72.0 in | Wt 190.0 lb

## 2022-06-05 DIAGNOSIS — M2550 Pain in unspecified joint: Secondary | ICD-10-CM

## 2022-06-05 DIAGNOSIS — M25471 Effusion, right ankle: Secondary | ICD-10-CM

## 2022-06-05 DIAGNOSIS — M65261 Calcific tendinitis, right lower leg: Secondary | ICD-10-CM | POA: Diagnosis not present

## 2022-06-05 DIAGNOSIS — M25571 Pain in right ankle and joints of right foot: Secondary | ICD-10-CM | POA: Diagnosis not present

## 2022-06-05 DIAGNOSIS — M65262 Calcific tendinitis, left lower leg: Secondary | ICD-10-CM

## 2022-06-05 LAB — CBC WITH DIFFERENTIAL/PLATELET
Basophils Absolute: 0 10*3/uL (ref 0.0–0.1)
Basophils Relative: 0.4 % (ref 0.0–3.0)
Eosinophils Absolute: 0.2 10*3/uL (ref 0.0–0.7)
Eosinophils Relative: 2.1 % (ref 0.0–5.0)
HCT: 43.3 % (ref 39.0–52.0)
Hemoglobin: 14.5 g/dL (ref 13.0–17.0)
Lymphocytes Relative: 17.9 % (ref 12.0–46.0)
Lymphs Abs: 1.5 10*3/uL (ref 0.7–4.0)
MCHC: 33.6 g/dL (ref 30.0–36.0)
MCV: 89.2 fl (ref 78.0–100.0)
Monocytes Absolute: 0.5 10*3/uL (ref 0.1–1.0)
Monocytes Relative: 5.4 % (ref 3.0–12.0)
Neutro Abs: 6.2 10*3/uL (ref 1.4–7.7)
Neutrophils Relative %: 74.2 % (ref 43.0–77.0)
Platelets: 318 10*3/uL (ref 150.0–400.0)
RBC: 4.85 Mil/uL (ref 4.22–5.81)
RDW: 12.8 % (ref 11.5–15.5)
WBC: 8.3 10*3/uL (ref 4.0–10.5)

## 2022-06-05 LAB — T3, FREE: T3, Free: 4 pg/mL (ref 2.3–4.2)

## 2022-06-05 LAB — T4, FREE: Free T4: 0.83 ng/dL (ref 0.60–1.60)

## 2022-06-05 LAB — IBC PANEL
Iron: 120 ug/dL (ref 42–165)
Saturation Ratios: 33.9 % (ref 20.0–50.0)
TIBC: 354.2 ug/dL (ref 250.0–450.0)
Transferrin: 253 mg/dL (ref 212.0–360.0)

## 2022-06-05 LAB — COMPREHENSIVE METABOLIC PANEL
ALT: 13 U/L (ref 0–53)
AST: 15 U/L (ref 0–37)
Albumin: 4.8 g/dL (ref 3.5–5.2)
Alkaline Phosphatase: 81 U/L (ref 39–117)
BUN: 13 mg/dL (ref 6–23)
CO2: 30 mEq/L (ref 19–32)
Calcium: 9.6 mg/dL (ref 8.4–10.5)
Chloride: 104 mEq/L (ref 96–112)
Creatinine, Ser: 0.88 mg/dL (ref 0.40–1.50)
GFR: 108.57 mL/min (ref >60.00)
Glucose, Bld: 91 mg/dL (ref 70–99)
Potassium: 4.1 mEq/L (ref 3.5–5.1)
Sodium: 142 mEq/L (ref 135–145)
Total Bilirubin: 0.4 mg/dL (ref 0.2–1.2)
Total Protein: 7.2 g/dL (ref 6.0–8.3)

## 2022-06-05 LAB — C-REACTIVE PROTEIN: CRP: <1 mg/dL (ref 0.5–20.0)

## 2022-06-05 LAB — SEDIMENTATION RATE: Sed Rate: 5 mm/hr (ref 0–15)

## 2022-06-05 LAB — FERRITIN: Ferritin: 140.7 ng/mL (ref 22.0–322.0)

## 2022-06-05 LAB — VITAMIN B12: Vitamin B-12: 398 pg/mL (ref 211–911)

## 2022-06-05 LAB — URIC ACID: Uric Acid, Serum: 7.6 mg/dL (ref 4.0–7.8)

## 2022-06-05 LAB — TSH: TSH: 2.27 u[IU]/mL (ref 0.35–5.50)

## 2022-06-05 MED ORDER — NITROGLYCERIN 0.2 MG/HR TD PT24: MEDICATED_PATCH | TRANSDERMAL | 0 refills | Status: AC

## 2022-06-05 NOTE — Assessment & Plan Note (Addendum)
No significant improvement yet at this time.  Started on nitroglycerin and warned to potential side effects with patient has done this in the past with another injury.  Discussed with treatment of possible using a counterforce brace, continue the home exercises.  Worsening pain will need to consider the injections.  Discussed potential PRP. Due to the abnormality on the calcific tendinosis I do feel that we should further evaluate to make sure there is no underlying autoimmune disease that can be contributing.

## 2022-06-05 NOTE — Assessment & Plan Note (Signed)
Still has some swelling noted.  Given choice of potential injection which patient declined.  Not going to be playing tennis for another 2 to 3 months and we will hold at this point.  Will consider it in the long run.

## 2022-06-05 NOTE — Patient Instructions (Addendum)
Good to see you  Nitro patch refilled  Nitroglycerin Protocol   Apply 1/4 nitroglycerin patch to affected area daily.  Change position of patch within the affected area every 24 hours.  You may experience a headache during the first 1-2 weeks of using the patch, these should subside.  If you experience headaches after beginning nitroglycerin patch treatment, you may take your preferred over the counter pain reliever.  Another side effect of the nitroglycerin patch is skin irritation or rash related to patch adhesive.  Please notify our office if you develop more severe headaches or rash, and stop the patch.  Tendon healing with nitroglycerin patch may require 12 to 24 weeks depending on the extent of injury.  Men should not use if taking Viagra, Cialis, or Levitra.   Do not use if you have migraines or rosacea.  Ankles looking good lets give it more time  Follow up in 6 weeks

## 2022-06-05 NOTE — Progress Notes (Signed)
[facility] [place] Phone: [phone] Subjective:   David Wallace, am serving as a scribe for Dr. Hulan Saas.  I'm seeing this patient by the request  of:  [name], MD  CC: right ankle pain   [id number]   03/27/2022 David Wallace is a 42 y.o. male coming in with complaint of R ankle pain.  Patient still has an effusion of the ankle noted.  Patient was to wear cam walker and take a short course of prednisone and transition to the meloxicam.  X-rays only showed the effusion but otherwise nothing else significant.  This was independently visualized by me today.  Patient states that the boot helped to alleviate his pain. Has not worn boot for 2 days. Back to regular shoes. Only notices pain with forced PF like when he puts on his shoes. Has not tried tennis. Hiked 2 miles yesterday without pain.    B knee pain is now more present now that ankle pain has subsided. Painful to go up and down stairs over patellar tendons.   Update 06/05/2022 David Wallace is a 42 y.o. male coming in with complaint of right ankle pain. Patient states that the ankle is about 90% better. Still has swelling and at points will still have pain. Patient more worried about his bilateral knee pain. The left knee is bothering him a lot more. Still having to think twice before doing things like walking down steps. 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[place])    age 60   Past Surgical History:  Procedure Laterality Date   intestinal obstruction  2005   ORCHIECTOMY     WISDOM TOOTH EXTRACTION     Social History   Socioeconomic History   Marital status: Married    Spouse name: Not on file   Number of children: Not on file   Years of education: Not on file   Highest education level: Not on file   Occupational History   Not on file  Tobacco Use   Smoking status: Never   Smokeless tobacco: Never  Substance and Sexual Activity   Alcohol use: Yes    Alcohol/week: 1.0 - 2.0 standard drink of alcohol    Types: 1 - 2 Standard drinks or equivalent per week   Drug use: No   Sexual activity: Not on file  Other Topics Concern   Not on file  Social History Narrative   Not on file   Social Determinants of Health   Financial Resource Strain: Not on file  Food Insecurity: Not on file  Transportation Needs: Not on file  Physical Activity: Not on file  Stress: Not on file  Social Connections: Not on file   No Known Allergies Family History  Problem Relation Age of Onset   Heart attack Father    Heart disease Paternal Uncle    Cancer Maternal Grandmother    Arrhythmia Maternal Grandmother    Cancer Maternal Grandfather    Dementia Paternal Grandmother    Heart disease Paternal Grandmother    Cancer Paternal Grandfather    Heart disease Paternal Uncle      Current Outpatient Medications (Cardiovascular):    nitroGLYCERIN (NITRO-DUR) 0.2 mg/hr patch, Apply 1/4 of a patch to skin once daily.   Current  Outpatient Medications (Analgesics):    meloxicam (MOBIC) 15 MG tablet, TAKE 1 TABLET BY MOUTH DAILY.   Current Outpatient Medications (Other):    Vitamin D, Ergocalciferol, (DRISDOL) 1.25 MG (50000 UNIT) CAPS capsule, Take 1 capsule (50,000 Units total) by mouth every 7 (seven) days.   Reviewed prior external information including notes and imaging from  primary care provider As well as notes that were available from care everywhere and other healthcare systems.  Past medical history, social, surgical and family history all reviewed in electronic medical record.  No pertanent information unless stated regarding to the chief complaint.   Review of Systems:  No headache, visual changes, nausea, vomiting, diarrhea, constipation, dizziness, abdominal pain, skin rash, fevers,  chills, night sweats, weight loss, swollen lymph nodes, body aches, joint swelling, chest pain, shortness of breath, mood changes. POSITIVE muscle aches  Objective  Blood pressure 110/82, pulse 66, height 6' (1.829 m), weight 190 lb (86.2 kg), SpO2 98 %.   General: No apparent distress alert and oriented x3 mood and affect normal, dressed appropriately.  HEENT: Pupils equal, extraocular movements intact  Respiratory: Patient's speak in full sentences and does not appear short of breath  Cardiovascular: No lower extremity edema, non tender, no erythema  Right ankle exam still has some small effusion noted on the TFL area.  Patient does have tenderness in the area.  Patient has good range of motion but still lacks some dorsiflexion.  Knee exams bilaterally still has tenderness to palpation over the patella tendon anteriorly.  This is more in the proximal aspect.  Limited muscular skeletal ultrasound was performed and interpreted by Hulan Saas, M  Limited ultrasound of patient's knees bilaterally show that there is calcific changes that is fairly significant of the proximal patella tendon.  No significant retraction or true tearing appreciated.  Seems to be bilateral. Impression: Calcific tendinosis fairly severe but symmetric    Impression and Recommendations:     The above documentation has been reviewed and is accurate and complete [name], DO

## 2022-06-10 LAB — VITAMIN D 1,25 DIHYDROXY
Vitamin D 1, 25 (OH)2 Total: 57 pg/mL (ref 18–72)
Vitamin D2 1, 25 (OH)2: 36 pg/mL
Vitamin D3 1, 25 (OH)2: 21 pg/mL

## 2022-06-10 LAB — ANA: Anti Nuclear Antibody (ANA): POSITIVE — AB

## 2022-06-10 LAB — ANTI-NUCLEAR AB-TITER (ANA TITER): ANA Titer 1: 1:80 {titer} — ABNORMAL HIGH

## 2022-06-10 LAB — CYCLIC CITRUL PEPTIDE ANTIBODY, IGG: Cyclic Citrullin Peptide Ab: <16 UNITS

## 2022-06-10 LAB — ACETAMINOPHEN LEVEL: Acetaminophen (Tylenol), Serum: <10 mg/L — ABNORMAL LOW (ref 10–20)

## 2022-06-10 LAB — RHEUMATOID FACTOR: Rheumatoid fact SerPl-aCnc: <14 IU/mL (ref <14)

## 2022-06-10 LAB — PTH, INTACT AND CALCIUM
Calcium: 9.9 mg/dL (ref 8.6–10.3)
PTH: 29 pg/mL (ref 16–77)

## 2022-06-10 LAB — CALCIUM, IONIZED: Calcium, Ion: 5 mg/dL (ref 4.7–5.5)

## 2022-07-25 NOTE — Progress Notes (Deleted)
[facility] [address] [place] Phone: [phone] Subjective:    I'm seeing this patient by the request  of:  [name], MD  CC:   [id number]  06/05/2022 No significant improvement yet at this time.  Started on nitroglycerin and warned to potential side effects with patient has done this in the past with another injury.  Discussed with treatment of possible using a counterforce brace, continue the home exercises.  Worsening pain will need to consider the injections.  Discussed potential PRP. Due to the abnormality on the calcific tendinosis I do feel that we should further evaluate to make sure there is no underlying autoimmune disease that can be contributing  Still has some swelling noted. Given choice of potential injection which patient declined. Not going to be playing tennis for another 2 to 3 months and we will hold at this point. Will consider it in the long run   Updated 07/30/2022 David Wallace is a 42 y.o. 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[place])    age 23   Past Surgical History:  Procedure Laterality Date   intestinal obstruction  2005   ORCHIECTOMY     WISDOM TOOTH EXTRACTION     Social History   Socioeconomic History   Marital status: Married    Spouse name: Not on file   Number of children: Not on file   Years of education: Not on file   Highest education level: Not on file  Occupational History   Not on file  Tobacco Use   Smoking status: Never   Smokeless tobacco: Never  Substance and Sexual Activity   Alcohol use: Yes    Alcohol/week: 1.0 - 2.0 standard drink of alcohol    Types: 1 - 2 Standard drinks or equivalent per week   Drug use: No   Sexual  activity: Not on file  Other Topics Concern   Not on file  Social History Narrative   Not on file   Social Determinants of Health   Financial Resource Strain: Not on file  Food Insecurity: Not on file  Transportation Needs: Not on file  Physical Activity: Not on file  Stress: Not on file  Social Connections: Not on file   No Known Allergies Family History  Problem Relation Age of Onset   Heart attack Father    Heart disease Paternal Uncle    Cancer Maternal Grandmother    Arrhythmia Maternal Grandmother    Cancer Maternal Grandfather    Dementia Paternal Grandmother    Heart disease Paternal Grandmother    Cancer Paternal Grandfather    Heart disease Paternal Uncle      Current Outpatient Medications (Cardiovascular):    nitroGLYCERIN (NITRO-DUR) 0.2 mg/hr patch, Apply 1/4 of a patch to skin once daily.   Current Outpatient Medications (Analgesics):    meloxicam (MOBIC) 15 MG tablet, TAKE 1 TABLET BY MOUTH DAILY.   Current Outpatient Medications (Other):    Vitamin D, Ergocalciferol, (DRISDOL) 1.25 MG (50000 UNIT) CAPS capsule, Take 1 capsule (50,000 Units total) by mouth every 7 (seven) days.   Reviewed prior external information including notes and imaging from  primary care provider As well as notes that were available from care  everywhere and other healthcare systems.  Past medical history, social, surgical and family history all reviewed in electronic medical record.  No pertanent information unless stated regarding to the chief complaint.   Review of Systems:  No headache, visual changes, nausea, vomiting, diarrhea, constipation, dizziness, abdominal pain, skin rash, fevers, chills, night sweats, weight loss, swollen lymph nodes, body aches, joint swelling, chest pain, shortness of breath, mood changes. POSITIVE muscle aches  Objective  There were no vitals taken for this visit.   General: No apparent distress alert and oriented x3 mood and affect normal,  dressed appropriately.  HEENT: Pupils equal, extraocular movements intact  Respiratory: Patient's speak in full sentences and does not appear short of breath  Cardiovascular: No lower extremity edema, non tender, no erythema      Impression and Recommendations:

## 2022-07-30 ENCOUNTER — Ambulatory Visit: Payer: BC Managed Care – PPO | Admitting: Family Medicine

## 2022-07-30 NOTE — Progress Notes (Unsigned)
[facility] [place] Phone: [phone] Subjective:   David Wallace, am serving as a scribe for Dr. Hulan Saas.  I'm seeing this patient by the request  of:  [name], MD  CC: knee pain   [id number]  06/05/2022 No significant improvement yet at this time.  Started on nitroglycerin and warned to potential side effects with patient has done this in the past with another injury.  Discussed with treatment of possible using a counterforce brace, continue the home exercises.  Worsening pain will need to consider the injections.  Discussed potential PRP. Due to the abnormality on the calcific tendinosis I do feel that we should further evaluate to make sure there is no underlying autoimmune disease that can be contributing     Still has some swelling noted. Given choice of potential injection which patient declined. Not going to be playing tennis for another 2 to 3 months and we will hold at this point. Will consider it in the long run   Updated 07/31/2022 David Wallace is a 42 y.o. male coming in with complaint of ankle and knee pain knees are still hurting constantly worse on the stairs so no change since last visit. The right ankle feels a lot better will get twinges her and there. Patient is playing tennis for the first time since the injury tomorrow and states he still doesn't feel quite right and would like to talk about that. Would like to go over the blood work as well.       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[place])    age 73   Past Surgical History:  Procedure Laterality Date   intestinal obstruction  2005   ORCHIECTOMY     WISDOM TOOTH EXTRACTION     Social History   Socioeconomic History   Marital status: Married    Spouse name: Not on file   Number of children: Not on file   Years of education: Not on file   Highest  education level: Not on file  Occupational History   Not on file  Tobacco Use   Smoking status: Never   Smokeless tobacco: Never  Substance and Sexual Activity   Alcohol use: Yes    Alcohol/week: 1.0 - 2.0 standard drink of alcohol    Types: 1 - 2 Standard drinks or equivalent per week   Drug use: No   Sexual activity: Not on file  Other Topics Concern   Not on file  Social History Narrative   Not on file   Social Determinants of Health   Financial Resource Strain: Not on file  Food Insecurity: Not on file  Transportation Needs: Not on file  Physical Activity: Not on file  Stress: Not on file  Social Connections: Not on file   No Known Allergies Family History  Problem Relation Age of Onset   Heart attack Father    Heart disease Paternal Uncle    Cancer Maternal Grandmother    Arrhythmia Maternal Grandmother    Cancer Maternal Grandfather    Dementia Paternal Grandmother    Heart disease Paternal Grandmother    Cancer Paternal Grandfather    Heart disease Paternal Uncle      Current Outpatient Medications (Cardiovascular):    nitroGLYCERIN (NITRO-DUR) 0.2 mg/hr patch, Apply 1/4 of a patch to skin once daily.   Current Outpatient Medications (Analgesics):  meloxicam (MOBIC) 15 MG tablet, TAKE 1 TABLET BY MOUTH DAILY.   Current Outpatient Medications (Other):    Vitamin D, Ergocalciferol, (DRISDOL) 1.25 MG (50000 UNIT) CAPS capsule, Take 1 capsule (50,000 Units total) by mouth every 7 (seven) days.   Reviewed prior external information including notes and imaging from  primary care provider As well as notes that were available from care everywhere and other healthcare systems.  Past medical history, social, surgical and family history all reviewed in electronic medical record.  No pertanent information unless stated regarding to the chief complaint.   Review of Systems:  No headache, visual changes, nausea, vomiting, diarrhea, constipation, dizziness,  abdominal pain, skin rash, fevers, chills, night sweats, weight loss, swollen lymph nodes, body aches, joint swelling, chest pain, shortness of breath, mood changes. POSITIVE muscle aches  Objective  Blood pressure 120/78, pulse 88, height 6' (1.829 m), weight 200 lb (90.7 kg), SpO2 99 %.   General: No apparent distress alert and oriented x3 mood and affect normal, dressed appropriately.  HEENT: Pupils equal, extraocular movements intact  Respiratory: Patient's speak in full sentences and does not appear short of breath  Cardiovascular: No lower extremity edema, non tender, no erythema  Bilateral knee crepitus noted ttp anterior on the inferior patella area.  Patient noted does have pain on the anterior medial aspect of the knee as well.  Limited muscular skeletal ultrasound was performed and interpreted by Hulan Saas, M  Patient does have hypoechoic changes of the patellofemoral joint on both areas.  Patient does have questionable loose bodies in this area.  Still has calcific changes noted of the patella tendon. Impression: Knee joint effusion with mild questionable loose bodies as well as still calcific tendinitis of the knees.  After informed written and verbal consent, patient was seated on exam table. Right knee was prepped with alcohol swab and utilizing anterolateral approach, patient's right knee space was injected with 4:1  marcaine 0.5%: Kenalog '40mg'$ /dL. Patient tolerated the procedure well without immediate complications.  After informed written and verbal consent, patient was seated on exam table. Left knee was prepped with alcohol swab and utilizing anterolateral approach, patient's left knee space was injected with 4:1  marcaine 0.5%: Kenalog '40mg'$ /dL. Patient tolerated the procedure well without immediate complications.    Impression and Recommendations:     The above documentation has been reviewed and is accurate and complete [name], DO

## 2022-07-31 ENCOUNTER — Ambulatory Visit: Payer: BC Managed Care – PPO | Admitting: Family Medicine

## 2022-07-31 ENCOUNTER — Ambulatory Visit: Payer: Self-pay

## 2022-07-31 VITALS — BP 120/78 | HR 88 | Ht 72.0 in | Wt 200.0 lb

## 2022-07-31 DIAGNOSIS — M65261 Calcific tendinitis, right lower leg: Secondary | ICD-10-CM | POA: Diagnosis not present

## 2022-07-31 DIAGNOSIS — M25571 Pain in right ankle and joints of right foot: Secondary | ICD-10-CM | POA: Diagnosis not present

## 2022-07-31 DIAGNOSIS — M65262 Calcific tendinitis, left lower leg: Secondary | ICD-10-CM | POA: Diagnosis not present

## 2022-07-31 NOTE — Patient Instructions (Signed)
Good to see you Read over PRP Injections given today

## 2022-08-01 ENCOUNTER — Encounter: Payer: Self-pay | Admitting: Family Medicine

## 2022-08-01 NOTE — Assessment & Plan Note (Addendum)
Worsening pain noted attempted bilateral knee injections with patient trying to increase his activity.  Patient does have calcific changes as well and we did discuss the possibility of PRP for the calcific tendinitis and will consider this.  Patient will start to increase activity and see how he responds.  Follow-up with me again in 6 to 8 weeks.Discussed anti-inflammatories as well.  Including meloxicam

## 2022-09-12 ENCOUNTER — Ambulatory Visit: Payer: BC Managed Care – PPO | Admitting: Family Medicine
